# Patient Record
Sex: Female | Born: 1955 | Hispanic: No | Marital: Married | Smoking: Former smoker
Health system: Southern US, Community
[De-identification: ages and names within clinical notes are randomized; demographics above are authoritative.]

## PROBLEM LIST (undated history)

## (undated) DIAGNOSIS — Z91018 Allergy to other foods: Secondary | ICD-10-CM

## (undated) DIAGNOSIS — I82409 Acute embolism and thrombosis of unspecified deep veins of unspecified lower extremity: Secondary | ICD-10-CM

## (undated) DIAGNOSIS — K5120 Ulcerative (chronic) proctitis without complications: Secondary | ICD-10-CM

## (undated) DIAGNOSIS — N83209 Unspecified ovarian cyst, unspecified side: Secondary | ICD-10-CM

## (undated) DIAGNOSIS — N281 Cyst of kidney, acquired: Secondary | ICD-10-CM

## (undated) DIAGNOSIS — M810 Age-related osteoporosis without current pathological fracture: Secondary | ICD-10-CM

## (undated) DIAGNOSIS — B1920 Unspecified viral hepatitis C without hepatic coma: Secondary | ICD-10-CM

## (undated) DIAGNOSIS — T781XXA Other adverse food reactions, not elsewhere classified, initial encounter: Secondary | ICD-10-CM

## (undated) HISTORY — DX: Cyst of kidney, acquired: N28.1

## (undated) HISTORY — PX: COLONOSCOPY: SHX174

## (undated) HISTORY — DX: Allergy to other foods: Z91.018

## (undated) HISTORY — DX: Other adverse food reactions, not elsewhere classified, initial encounter: T78.1XXA

## (undated) HISTORY — DX: Age-related osteoporosis without current pathological fracture: M81.0

## (undated) HISTORY — DX: Ulcerative (chronic) proctitis without complications: K51.20

## (undated) HISTORY — DX: Unspecified viral hepatitis C without hepatic coma: B19.20

## (undated) HISTORY — DX: Acute embolism and thrombosis of unspecified deep veins of unspecified lower extremity: I82.409

## (undated) HISTORY — PX: PARTIAL HYSTERECTOMY: SHX80

## (undated) HISTORY — DX: Unspecified ovarian cyst, unspecified side: N83.209

---

## 1998-06-02 ENCOUNTER — Other Ambulatory Visit: Admission: RE | Admit: 1998-06-02 | Discharge: 1998-06-02 | Payer: Self-pay | Admitting: Obstetrics and Gynecology

## 1999-06-11 ENCOUNTER — Other Ambulatory Visit: Admission: RE | Admit: 1999-06-11 | Discharge: 1999-06-11 | Payer: Self-pay | Admitting: Obstetrics and Gynecology

## 2000-07-07 ENCOUNTER — Other Ambulatory Visit: Admission: RE | Admit: 2000-07-07 | Discharge: 2000-07-07 | Payer: Self-pay | Admitting: Obstetrics and Gynecology

## 2001-09-20 ENCOUNTER — Other Ambulatory Visit: Admission: RE | Admit: 2001-09-20 | Discharge: 2001-09-20 | Payer: Self-pay | Admitting: Obstetrics and Gynecology

## 2001-10-17 ENCOUNTER — Emergency Department (HOSPITAL_COMMUNITY): Admission: EM | Admit: 2001-10-17 | Discharge: 2001-10-17 | Payer: Self-pay | Admitting: Emergency Medicine

## 2002-08-07 ENCOUNTER — Other Ambulatory Visit: Admission: RE | Admit: 2002-08-07 | Discharge: 2002-08-07 | Payer: Self-pay | Admitting: Obstetrics and Gynecology

## 2002-10-22 ENCOUNTER — Encounter (INDEPENDENT_AMBULATORY_CARE_PROVIDER_SITE_OTHER): Payer: Self-pay

## 2002-10-22 ENCOUNTER — Observation Stay (HOSPITAL_COMMUNITY): Admission: RE | Admit: 2002-10-22 | Discharge: 2002-10-23 | Payer: Self-pay | Admitting: Obstetrics and Gynecology

## 2003-12-23 ENCOUNTER — Other Ambulatory Visit: Admission: RE | Admit: 2003-12-23 | Discharge: 2003-12-23 | Payer: Self-pay | Admitting: Obstetrics and Gynecology

## 2004-06-19 ENCOUNTER — Ambulatory Visit: Payer: Self-pay | Admitting: Internal Medicine

## 2005-01-18 ENCOUNTER — Other Ambulatory Visit: Admission: RE | Admit: 2005-01-18 | Discharge: 2005-01-18 | Payer: Self-pay | Admitting: Obstetrics and Gynecology

## 2005-04-13 ENCOUNTER — Ambulatory Visit: Payer: Self-pay | Admitting: Internal Medicine

## 2005-04-21 ENCOUNTER — Ambulatory Visit: Payer: Self-pay

## 2008-04-30 ENCOUNTER — Encounter: Payer: Self-pay | Admitting: Internal Medicine

## 2008-07-16 ENCOUNTER — Ambulatory Visit: Payer: Self-pay | Admitting: Internal Medicine

## 2008-07-16 DIAGNOSIS — M940 Chondrocostal junction syndrome [Tietze]: Secondary | ICD-10-CM

## 2008-07-16 DIAGNOSIS — R740 Nonspecific elevation of levels of transaminase and lactic acid dehydrogenase [LDH]: Secondary | ICD-10-CM

## 2008-07-16 DIAGNOSIS — R7401 Elevation of levels of liver transaminase levels: Secondary | ICD-10-CM | POA: Insufficient documentation

## 2008-07-16 HISTORY — DX: Chondrocostal junction syndrome (tietze): M94.0

## 2008-08-01 ENCOUNTER — Ambulatory Visit: Payer: Self-pay | Admitting: Internal Medicine

## 2008-08-11 ENCOUNTER — Encounter: Payer: Self-pay | Admitting: Internal Medicine

## 2008-08-11 LAB — CONVERTED CEMR LAB
ALT: 76 units/L — ABNORMAL HIGH (ref 0–35)
AST: 69 units/L — ABNORMAL HIGH (ref 0–37)
Albumin: 4 g/dL (ref 3.5–5.2)
Alkaline Phosphatase: 66 units/L (ref 39–117)
Bilirubin, Direct: 0.1 mg/dL (ref 0.0–0.3)
Hgb A1c MFr Bld: 5.5 % (ref 4.6–6.0)
Total Bilirubin: 0.6 mg/dL (ref 0.3–1.2)
Total Protein: 7.9 g/dL (ref 6.0–8.3)

## 2008-08-12 ENCOUNTER — Encounter (INDEPENDENT_AMBULATORY_CARE_PROVIDER_SITE_OTHER): Payer: Self-pay | Admitting: *Deleted

## 2008-08-12 ENCOUNTER — Telehealth (INDEPENDENT_AMBULATORY_CARE_PROVIDER_SITE_OTHER): Payer: Self-pay | Admitting: *Deleted

## 2008-08-19 ENCOUNTER — Encounter: Admission: RE | Admit: 2008-08-19 | Discharge: 2008-08-19 | Payer: Self-pay | Admitting: Family Medicine

## 2008-08-21 ENCOUNTER — Telehealth (INDEPENDENT_AMBULATORY_CARE_PROVIDER_SITE_OTHER): Payer: Self-pay | Admitting: *Deleted

## 2009-02-07 ENCOUNTER — Emergency Department (HOSPITAL_BASED_OUTPATIENT_CLINIC_OR_DEPARTMENT_OTHER): Admission: EM | Admit: 2009-02-07 | Discharge: 2009-02-07 | Payer: Self-pay | Admitting: Emergency Medicine

## 2009-02-12 ENCOUNTER — Ambulatory Visit: Payer: Self-pay | Admitting: Vascular Surgery

## 2009-05-23 ENCOUNTER — Ambulatory Visit: Payer: Self-pay | Admitting: Vascular Surgery

## 2009-05-23 ENCOUNTER — Encounter: Payer: Self-pay | Admitting: Internal Medicine

## 2010-12-08 NOTE — Assessment & Plan Note (Signed)
OFFICE VISIT   Dawn Yang, Dawn Yang  DOB:  1956/05/30                                       05/23/2009  HKVQQ#:[id number]   The patient presents today for continued followup of her venous  pathology.  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  She has had complete  resolution of the thrombus.  She does continue to have pain over these  areas with prolonged standing.  She does wear thigh-high graduated  compression stockings but sees no benefit from this.   PAST MEDICAL HISTORY:  Otherwise unchanged.  She has no new major  medical difficulties.  She denies any major cardiac, pulmonary or GI  dysfunction.   PHYSICAL EXAMINATION:  Today, she does continue to have large  varicosities over her left medial calf.  She does have some tenderness  specifically over these and has pain with prolonged standing related to  this as well.  She is 2+ dorsalis pedis pulse.  Generally, she is  neurologically intact and does not have any current thrombophlebitis.   I reviewed her venous duplex with the patient.  I explained again the  significance of her venous hypertension and the relation to the  varicosities.  I have recommended that we proceed with laser ablation of  her great saphenous vein and stab phlebectomy of her multiple tributary  varicosities.  She has clearly failed conservative therapy with  elevation, ibuprofen and compression.  She reports that she likes to  exercise and walk for this and has to decrease due to the pain.  Also  experiences pain with prolonged sitting which her job requires and also  difficulty with squatting position, making leg work and housework  difficult.  I explained the procedure is an outpatient procedure taking  approximately 1-1/2 hours under local anesthesia.  She understands and  wishes to  proceed when we can assure insurance coverage.   Larina Earthly, M.D.  Electronically Signed   TFE/MEDQ  D:  05/23/2009  T:  05/26/2009  Job:  [id number]   cc:   Titus Dubin. Alwyn Ren, MD,FACP,FCCP

## 2010-12-08 NOTE — Procedures (Signed)
LOWER EXTREMITY VENOUS REFLUX EXAM   INDICATION:  Left lower extremity painful varicose veins.   EXAM:  Using color-flow imaging and pulse Doppler spectral analysis, the  left common femoral, superficial femoral, popliteal, posterior tibial,  greater and lesser saphenous veins are evaluated.  There is no evidence  suggesting deep venous insufficiency in the left lower extremity.   The left saphenofemoral junction is not competent with reflux of >500  milliseconds.  The left GSV is not competent with reflux of >500  milliseconds with the caliber as described below.   The left proximal short saphenous vein demonstrates competency.   GSV Diameter (used if found to be incompetent only)                                            Right    Left  Proximal Greater Saphenous Vein           cm       0.58 cm  Proximal-to-mid-thigh                     cm       cm  Mid thigh                                 cm       0.53 cm  Mid-distal thigh                          cm       cm  Distal thigh                              cm       0.56 cm  Knee                                      cm       0.55 cm   IMPRESSION:  1. Left greater saphenous vein reflux with >500 milliseconds is      identified with the caliber ranging from 0.53 cm to 0.58 cm knee to      groin.  2. The left greater saphenous vein is not aneurysmal.  3. The left greater saphenous vein is not tortuous.  4. The deep venous system is competent.  5. The left lesser saphenous vein is competent.  6. Evidence of acute thrombus in varicose vein off of left greater      saphenous vein in the proximal calf.        ___________________________________________  Larina Earthly, M.D.   AS/MEDQ  D:  02/12/2009  T:  02/12/2009  Job:  [id number]

## 2010-12-08 NOTE — Consult Note (Signed)
NEW PATIENT CONSULTATION   Dawn Yang, Dawn Yang  DOB:  08-01-1955                                       02/12/2009  ONGEX#:[id number]   The patient presents today for evaluation of left leg venous pathology.  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  She had an episode on 02/07/2009 where  she had blunt trauma to this area and had extensive bruising and  hematoma formation.  She was seen in the emergency department and she  was recommended she use ice, compression and walk on crutches.  She did  not have any imaging studies at that time.  She reports that this is  beginning to slowly resolve with the discomfort associated with this.  She does report that with prolonged standing prior to the injury she did  have pain and discomfort over the varicosity itself and also a tired,  heavy and achy sensation in her left leg in general.  She does report  lower extremity swelling on the left.  She has not worn compression  garments and does elevate the legs when possible.   PAST MEDICAL HISTORY:  Negative for deep venous thrombosis.  She has no  major medical difficulties specifically no cardiac or pulmonary  dysfunction.  She does have a family history of premature  atherosclerotic disease in her mother.   SOCIAL HISTORY:  She is married.  She does not smoke having quit 12  years ago.  Does have a glass of wine every other day.   REVIEW OF SYSTEMS:  CARDIAC:  Positive for palpitations.  VASCULAR:  Positive for pain in her legs with walking.  Otherwise  completely negative.   ALLERGIES:  Her medication allergies are to codeine, morphine and  hydrocodone which causes GI upset.   CURRENT MEDICATIONS:  Ambien p.r.n. and multivitamins.   PHYSICAL EXAMINATION:  General:  A well-developed, well-nourished white  female appearing stated age of 64.  Vital  signs:  Blood pressure 115/84,  pulse 72, respirations 18.  Her radial pulses are 2+.  She has 2+  dorsalis pedis pulses bilaterally.  Right leg does have some vascular  blushes around the level of her ankle and a few scattered telangiectasia  but no varicosities.  In the left leg she does have significant bruising  in the left medial calf and also has varicosities in this area.   She underwent venous duplex in our office.  This shows no evidence of  deep venous thrombosis.  She does have clot in the varicosities.  She  does have reflux throughout an enlarged great saphenous vein from her  groin distally.  I have discussed the significance of this with the  patient.  I explained that she is at no risk for increased risk for DVT  or other more serious complications.  I explained to resume her full  activity without limitation.  I explained that it will require some time  for resolution of her bruising and for superficial thrombophlebitis in  her saphenous tributary varicosities.  I did discuss further treatment  of her varicose vein pain and we have fitted her today with thigh high  graduated compression garments 20-30 mmHg.  I instructed her on the  daily use of these.  She will  elevate her legs when possible and  continue with her ibuprofen for discomfort.  I plan to see her again in  3 months for continued discussion.  I did explain that she is a  candidate for laser ablation of her great saphenous vein and stab  phlebectomy of tributary varicosities should she fail conservative  treatment.  We will see her again in 3 months.   Larina Earthly, M.D.  Electronically Signed   TFE/MEDQ  D:  02/12/2009  T:  02/13/2009  Job:  [id number]   cc:   Titus Dubin. Alwyn Ren, MD,FACP,FCCP

## 2010-12-11 NOTE — Op Note (Signed)
NAME:  Dawn Yang, Dawn Yang                ACCOUNT NO.:  [id number]   MEDICAL RECORD NO.:  [id number]                   PATIENT TYPE:  OBV   LOCATION:  9399                                 FACILITY:  [facility]   PHYSICIAN:  Juluis Mire, M.D.                DATE OF BIRTH:  01/21/1956   DATE OF PROCEDURE:  DATE OF DISCHARGE:                                 OPERATIVE REPORT   PREOPERATIVE DIAGNOSES:  1. Abnormal uterine bleeding and pelvic pain.  2. 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   POSTOPERATIVE DIAGNOSES:  1. Abnormal uterine bleeding and pelvic pain.  2. 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  3. Pathology pending.   PROCEDURE:  Laparoscopically-assisted vaginal hysterectomy.   SURGEON:  Juluis Mire, M.D.   ASSISTANT:  Dineen Kid. Rana Snare, M.D.   ESTIMATED BLOOD LOSS:  300 mL.   PACKS AND DRAINS:  None.   INTRAOPERATIVE BLOOD REPLACED:  None.   INDICATIONS:  The indications are as noted in the history and physical.   DESCRIPTION OF PROCEDURE:  The patient was taken to the operating room and  placed in the supine position.  After a satisfactory level of general  endotracheal anesthesia was obtained, the patient was placed in the dorsal  lithotomy position using the Allen stirrups.  The abdomen, perineum and  vagina were prepped out with Betadine.  The bladder was emptied by in and  out catheterization.  A Hulka tenaculum was put in place and secured.  The  patient was then draped out for surgery.  A subumbilical incision was made  with the knife.  The Veress needle was introduced into the abdominal cavity.  The abdomen was insufflated with approximately 4 liters of  carbon dioxide.  The operating laparoscope was introduced.  There was no evidence of injury  to adjacent organs.  A 5 mm trocar was put in place in the suprapubic area  under direct visualization. Again, no injury to adjacent organs.  Visualization revealed the uterus to be upper limits of normal in size.  Tubes and  ovaries were unremarkable.  There were some adhesions from the  left ovary to the sigmoid colon.  These were fairly minimal.  The appendix  was seen and noted to be normal.  The upper abdomen including the liver and  the tip of the gallbladder were clear.  Using the Gyrus tripolar, we first  went to the right side.  The right round ligament was cauterized and  incised.  The right tube and mesosalpinx was cauterized and incised, and the  right utero-ovarian pedicle was cauterized and incised.  We then went to the  left side where the left round ligament was cauterized and incised.  The  left tube and mesosalpinx were cauterized and incised.  The left utero-  ovarian pedicle was cauterized and incised. We had good separation of the  adnexa.  No active bleeding was noted.  The decision was to go vaginally at  this point.  The laparoscope was removed.  The abdomen was deflated of its  carbon dioxide.   The patient's legs were repositioned.  The Hulka tenaculum was then removed.  A weighted speculum was placed in the vaginal vault.  Jacob's tenaculum was  used to grasp the cervix.  The cul-de-sac was entered sharply.  Both of the  uterosacral ligaments were clamped, cut and suture ligated with 0 Vicryl.  The reflexion of the vaginal mucosa anteriorly was incised using the Bovie.  The bladder was dissected superiorly.  Paracervical tissue was clamped, cut  and suture ligated with 0 Vicryl.  The vesicouterine space was entered.  Retractor was put in place to retract the bladder superiorly.  Using the  clamp, cut and tie technique with a suture ligature of 0 Vicryl, the  parametrium was serially separated from the sides of the uterus and the  remaining pedicle was clamped and cut.  The uterus was passed off the  operative field.  Held pedicles were ligated with three ties of 0 Vicryl.  The vaginal mucosa was reapproximated in a vertical fashion in the midline  with figure-of-eight of 0 Vicryl  bringing together the uterosacral  ligaments.  Foley was placed to straight drain with retrieval of adequate  amount of clear urine.  The sponge stick was placed in the vaginal vault.  The weighted speculum was then removed, and the patient's legs were  repositioned.   The abdomen was reinflated of its carbon dioxide and laparoscope was  reintroduced.  There was some bleeding from the bladder base, mainly in the  form of oozing.  This was brought under control with cautery.  We had good  hemostasis.  At this point in time, adequate clear urine was being noted in  the Foley.  At this point in time, the abdomen was deflated of its carbon  dioxide.  All trocars were removed.  The subumbilical incision was closed  with interrupted subcuticulars of 4-0 Vicryl.  The suprapubic incision was  closed with Steri-Strips. The sponge on the sponge stick was removed from  the vaginal vault.  The patient was taken out of the dorsal lithotomy  position and once alert, extubated and transferred to the recovery room in  good condition.  Sponge, needle and instrument count reported correct by the  circulating nurse x2.                                               Juluis Mire, M.D.    JSM/MEDQ  D:  10/22/2002  T:  10/22/2002  Job:  [id number]

## 2010-12-11 NOTE — Discharge Summary (Signed)
   NAME:  Dawn Yang, Dawn Yang                ACCOUNT NO.:  [id number]   MEDICAL RECORD NO.:  [id number]                   PATIENT TYPE:  OBV   LOCATION:  9304                                 FACILITY:  [facility]   PHYSICIAN:  Juluis Mire, M.D.                DATE OF BIRTH:  [birth year]   DATE OF ADMISSION:  10/22/2002  DATE OF DISCHARGE:  10/23/2002                                 DISCHARGE SUMMARY   ADMITTING DIAGNOSES:  1. Menorrhagia.  2. Dysmenorrhea.  3. Possible uterine adenomyosis.   DISCHARGE DIAGNOSES:  1. Menorrhagia.  2. Dysmenorrhea.  3. Possible uterine adenomyosis.  4. Pathology pending.   OPERATIVE PROCEDURE:  Laparoscopic assisted vaginal hysterectomy.   HISTORY OF PRESENT ILLNESS:  For complete history and physical please see  dictated note.   HOSPITAL COURSE:  The patient underwent above noted surgery.  Postoperatively did extremely well.  Postoperative hemoglobin was 11.3.  Pathology is still pending.  Discharged home on first postoperative day.  At  that time she was afebrile with stable vital signs.  Abdomen is soft and  nontender.  Bowel sounds were active.  Both suprapubic and subumbilical  incisions were intact.  She was having minimal vaginal bleeding.   COMPLICATIONS:  In terms of complications, none were encountered during stay  in hospital.  The patient discharged home in stable condition.   DISPOSITION:  The patient is to avoid heavy lifting, vaginal entrance, or  driving a car.  She will watch for signs of infection, nausea, vomiting,  active vaginal bleeding, or excessive pain.  Discharged home with Demerol as  she needs for pain.  Reevaluation in the office in one week.                                               Juluis Mire, M.D.    JSM/MEDQ  D:  10/23/2002  T:  10/23/2002  Job:  [id number]

## 2010-12-11 NOTE — H&P (Signed)
NAME:  Dawn Yang, Dawn Yang                ACCOUNT NO.:  [id number]   MEDICAL RECORD NO.:  [id number]                   PATIENT TYPE:  AMB   LOCATION:  SDC                                  FACILITY:  [facility]   PHYSICIAN:  Juluis Mire, M.D.                DATE OF BIRTH:  Apr 15, 1956   DATE OF ADMISSION:  10/22/2002  DATE OF DISCHARGE:                                HISTORY & PHYSICAL   HISTORY OF PRESENT ILLNESS:  The patient is a 55 year old gravida 3, para 0,  AB-3 white female who presents for laparoscopic assisted vaginal  hysterectomy.  In relation to the present admission the patient has been  having trouble with increasing menstrual irregularities which have been  extremely heavy and painful.  Associated with this is increasing pain with  intercourse that is becoming limiting.  She is also having postcoital  spotting.  She had undergone a previous hysteroscopy and laparoscopy with  finding of an intrauterine polyp that was hysteroscopically removed and  found to be benign.  She did have overall uterine enlargement as well as  fibroids consistent with adenomyosis.  The patient has been offered options.  She does have a history of chronically elevated liver function tests,  because of this she cannot take birth control pills.  She declines other  attempts at hormonal management.  We have discussed other options including  endometrial ablation.  The patient wishes to proceed with definitive therapy  in the form of laparoscopic assisted vaginal hysterectomy for which she is  admitted at the present time.   ALLERGIES:  She is allergic to Codeine.   MEDICATIONS:  None.   PAST MEDICAL HISTORY:  Usual childhood diseases without any significant  sequela.  She does have a history of chronically elevated liver function  tests.  These have been evaluated by her primary are doctor with a basically  negative work up.   SURGICAL HISTORY:  She has had the previous hysteroscopy and  laparoscopy as  noted.   SOCIAL HISTORY:  Does reveal some past tobacco use although none at the  present time.  No alcohol use.   FAMILY HISTORY:  No significant family history.   REVIEW OF SYSTEMS:  Noncontributory.   PHYSICAL EXAMINATION:  VITAL SIGNS: The patient is afebrile with stable  vital signs.  HEENT EXAM: The patient is normocephalic.  Pupils are equal, round and  reactive to light and accommodation.  Extraocular movements are intact.  Sclerae and conjunctivae clear.  Oropharynx clear.  NECK: Without thyromegaly.  BREASTS: No discreet masses.  LUNGS: Clear.  CARDIOVASCULAR: Regular rate and rhythm without murmurs or gallops heard.  ABDOMINAL EXAM: Benign, no mass, organomegaly or tenderness.  PELVIC: Normal external genitalia, vaginal mucosa is clear.  Cervix  unremarkable. Uterus normal size, shape and contour.  Adnexae free of masses  or tenderness.  Rectovaginal exam is clear.  EXTREMITIES: Trace edema.  NEUROLOGIC EXAM: Grossly within normal limits.   IMPRESSION:  Abnormal uterine bleeding and continued pelvic pain, possible  uterine adenomyosis.   PLAN:  Again alternatives have been discussed, the patient wishes to proceed  with laparoscopic assisted vaginal hysterectomy.  The risks of surgery have  been discussed including the risk of infection.  The risk of hemorrhage that  can necessitate transfusion with the risk of AIDS or hepatitis. The risk of  injury to adjacent organs including bladder, bowel or ureters that could  require further exploratory surgery.  The risk of deep venous thrombosis and  pulmonary embolus.  The patient expressed her understanding of indications  and risks and accepted.                                                 Juluis Mire, M.D.    JSM/MEDQ  D:  10/22/2002  T:  10/22/2002  Job:  [id number]

## 2011-11-09 ENCOUNTER — Other Ambulatory Visit: Payer: Self-pay | Admitting: Vascular Surgery

## 2013-12-18 ENCOUNTER — Encounter: Payer: Self-pay | Admitting: Internal Medicine

## 2014-02-22 ENCOUNTER — Encounter: Payer: Self-pay | Admitting: Internal Medicine

## 2014-02-28 ENCOUNTER — Encounter: Payer: Self-pay | Admitting: Internal Medicine

## 2014-03-15 ENCOUNTER — Ambulatory Visit (AMBULATORY_SURGERY_CENTER): Payer: Self-pay | Admitting: *Deleted

## 2014-03-15 VITALS — Ht 61.0 in | Wt 148.0 lb

## 2014-03-15 DIAGNOSIS — Z1211 Encounter for screening for malignant neoplasm of colon: Secondary | ICD-10-CM

## 2014-03-15 MED ORDER — NA SULFATE-K SULFATE-MG SULF 17.5-3.13-1.6 GM/177ML PO SOLN: 1.0000 | Freq: Once | ORAL | Status: DC

## 2014-03-15 NOTE — Progress Notes (Signed)
No egg or soy allergy. No anesthesia problems.  No home O2.  No diet meds.  No metal in body.

## 2014-03-25 ENCOUNTER — Encounter: Payer: Self-pay | Admitting: Internal Medicine

## 2014-03-28 ENCOUNTER — Encounter: Payer: Self-pay | Admitting: Gastroenterology

## 2014-03-29 ENCOUNTER — Encounter: Payer: Self-pay | Admitting: Internal Medicine

## 2014-03-29 ENCOUNTER — Ambulatory Visit (AMBULATORY_SURGERY_CENTER): Payer: BC Managed Care – PPO | Admitting: Internal Medicine

## 2014-03-29 VITALS — BP 100/63 | HR 72 | Temp 97.3°F | Resp 17 | Ht 61.0 in | Wt 148.0 lb

## 2014-03-29 DIAGNOSIS — K5289 Other specified noninfective gastroenteritis and colitis: Secondary | ICD-10-CM

## 2014-03-29 DIAGNOSIS — K6289 Other specified diseases of anus and rectum: Secondary | ICD-10-CM

## 2014-03-29 DIAGNOSIS — Z1211 Encounter for screening for malignant neoplasm of colon: Secondary | ICD-10-CM

## 2014-03-29 MED ORDER — SODIUM CHLORIDE 0.9 % IV SOLN: 500.0000 mL | INTRAVENOUS | Status: DC

## 2014-03-29 NOTE — Progress Notes (Signed)
Report to PACU, RN, vss, BBS= Clear.  

## 2014-03-29 NOTE — Progress Notes (Signed)
Called to room to assist during endoscopic procedure.  Patient ID and intended procedure confirmed with present staff. Received instructions for my participation in the procedure from the performing physician.  

## 2014-03-29 NOTE — Op Note (Signed)
[place]  [address]. [place], [id number]   COLONOSCOPY PROCEDURE REPORT  PATIENT: Dawn Yang, Dawn Yang  MR#: [id number] BIRTHDATE: [birth year] , 22  yrs. old GENDER: Female ENDOSCOPIST: Gatha Mayer, MD, [facility] PROCEDURE DATE:  03/29/2014 PROCEDURE:   Colonoscopy with biopsy First Screening Colonoscopy - Avg.  risk and is 50 yrs.  old or older - No.  Prior Negative Screening - Now for repeat screening. 10 or more years since last screening  History of Adenoma - Now for follow-up colonoscopy & has been > or = to 3 yrs.  N/A  Polyps Removed Today? No.  Recommend repeat exam, <10 yrs? No. ASA CLASS:   Class II INDICATIONS:average risk screening and Last colonoscopy performed 10 years ago. MEDICATIONS: propofol (Diprivan) 300mg  IV, MAC sedation, administered by CRNA, and These medications were titrated to patient response per physician's verbal order  DESCRIPTION OF PROCEDURE:   After the risks benefits and alternatives of the procedure were thoroughly explained, informed consent was obtained.  A digital rectal exam revealed no abnormalities of the rectum.   The [facility] [id number]  endoscope was introduced through the anus and advanced to the cecum, which was identified by both the appendix and ileocecal valve. No adverse events experienced.   The quality of the prep was excellent using Suprep  The instrument was then slowly withdrawn as the colon was fully examined.  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 Very distal rectum with circunferential aphthae and granular mucosa.  Multiple biopsies were performed using cold forceps.   The colon mucosa was otherwise normal.   A right colon retroflexion was performed.  Retroflexed views revealed no other abnormalities. The time to cecum=3 minutes 37 seconds. Withdrawal time=8 minutes 39 seconds.  The scope was withdrawn and the procedure  completed. COMPLICATIONS: There were no complications.  ENDOSCOPIC IMPRESSION: 1.   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   The colon mucosa was otherwise normal  RECOMMENDATIONS: Office will call with the results.   eSigned:  Gatha Mayer, MD, [facility] 03/29/2014 3:33 PM   cc: The Patient

## 2014-03-29 NOTE — Patient Instructions (Addendum)
No polyps or cancer. There was a small area of inflammation in the rectum - could be from the prep - I took biopsies to see what the cause is.  I will let you know results next week most likely.  Enjoy your green drink and the burger!  I appreciate the opportunity to care for you. Gatha Mayer, MD, FACG   YOU HAD AN ENDOSCOPIC PROCEDURE TODAY AT [facility]: Refer to the procedure report that was given to you for any specific questions about what was found during the examination.  If the procedure report does not answer your questions, please call your gastroenterologist to clarify.  If you requested that your care partner not be given the details of your procedure findings, then the procedure report has been included in a sealed envelope for you to review at your convenience later.  YOU SHOULD EXPECT: Some feelings of bloating in the abdomen. Passage of more gas than usual.  Walking can help get rid of the air that was put into your GI tract during the procedure and reduce the bloating. If you had a lower endoscopy (such as a colonoscopy or flexible sigmoidoscopy) you may notice spotting of blood in your stool or on the toilet paper. If you underwent a bowel prep for your procedure, then you may not have a normal bowel movement for a few days.  DIET: Your first meal following the procedure should be a light meal and then it is ok to progress to your normal diet.  A half-sandwich or bowl of soup is an example of a good first meal.  Heavy or fried foods are harder to digest and may make you feel nauseous or bloated.  Likewise meals heavy in dairy and vegetables can cause extra gas to form and this can also increase the bloating.  Drink plenty of fluids but you should avoid alcoholic beverages for 24 hours.  ACTIVITY: Your care partner should take you home directly after the procedure.  You should plan to take it easy, moving slowly for the rest of the day.  You can resume normal  activity the day after the procedure however you should NOT DRIVE or use heavy machinery for 24 hours (because of the sedation medicines used during the test).    SYMPTOMS TO REPORT IMMEDIATELY: A gastroenterologist can be reached at any hour.  During normal business hours, 8:30 AM to 5:00 PM Monday through Friday, call [phone].  After hours and on weekends, please call the GI answering service at [phone] who will take a message and have the physician on call contact you.   Following lower endoscopy (colonoscopy or flexible sigmoidoscopy):  Excessive amounts of blood in the stool  Significant tenderness or worsening of abdominal pains  Swelling of the abdomen that is new, acute  Fever of 100F or higher  Following upper endoscopy (EGD)  Vomiting of blood or coffee ground material  New chest pain or pain under the shoulder blades  Painful or persistently difficult swallowing  New shortness of breath  Fever of 100F or higher  Black, tarry-looking stools  FOLLOW UP: If any biopsies were taken you will be contacted by phone or by letter within the next 1-3 weeks.  Call your gastroenterologist if you have not heard about the biopsies in 3 weeks.  Our staff will call the home number listed on your records the next business day following your procedure to check on you and address any questions or concerns  that you may have at that time regarding the information given to you following your procedure. This is a courtesy call and so if there is no answer at the home number and we have not heard from you through the emergency physician on call, we will assume that you have returned to your regular daily activities without incident.  SIGNATURES/CONFIDENTIALITY: You and/or your care partner have signed paperwork which will be entered into your electronic medical record.  These signatures attest to the fact that that the information above on your After Visit Summary has been reviewed and is  understood.  Full responsibility of the confidentiality of this discharge information lies with you and/or your care-partner.   Await biopsy results

## 2014-04-02 ENCOUNTER — Telehealth: Payer: Self-pay

## 2014-04-02 NOTE — Telephone Encounter (Signed)
  Follow up Call-  Call back number 03/29/2014  Post procedure Call Back phone  # [phone]  Permission to leave phone message Yes     Patient questions:  Do you have a fever, pain , or abdominal swelling? No. Pain Score  0 *  Have you tolerated food without any problems? Yes.    Have you been able to return to your normal activities? Yes.    Do you have any questions about your discharge instructions: Diet   No. Medications  No. Follow up visit  No.  Do you have questions or concerns about your Care? No.  Actions: * If pain score is 4 or above: No action needed, pain <4.  Per the pt she said it took a couple of days for the anesthesia to wear out of her system.  Said had loose stools too.  I said rectal bx were taken and Dr. Carlean Purl would be getting in touch with her with the results.  To call if any questions or concerns.  maw

## 2014-04-04 ENCOUNTER — Encounter: Payer: Self-pay | Admitting: Internal Medicine

## 2014-04-04 DIAGNOSIS — K5120 Ulcerative (chronic) proctitis without complications: Secondary | ICD-10-CM

## 2014-04-04 HISTORY — DX: Ulcerative (chronic) proctitis without complications: K51.20

## 2014-04-04 NOTE — Progress Notes (Signed)
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

## 2014-04-05 ENCOUNTER — Other Ambulatory Visit: Payer: Self-pay

## 2014-04-05 MED ORDER — MESALAMINE 1000 MG RE SUPP: 1000.0000 mg | Freq: Every day | RECTAL | Status: DC

## 2014-05-01 ENCOUNTER — Ambulatory Visit: Payer: Self-pay | Admitting: Internal Medicine

## 2014-06-06 ENCOUNTER — Ambulatory Visit: Payer: BC Managed Care – PPO | Admitting: Internal Medicine

## 2015-04-01 ENCOUNTER — Telehealth: Payer: Self-pay | Admitting: Internal Medicine

## 2015-04-01 NOTE — Telephone Encounter (Signed)
Patient with RUQ pain this weekend after a meal.  She states that all of her symptoms have resolved now, but wants to initiate a work up prior to a big trip to [place] at the end of the month.  She will come see Arta Bruce, PA on 04/07/15 1:15

## 2015-04-02 ENCOUNTER — Encounter: Payer: Self-pay | Admitting: Internal Medicine

## 2015-04-08 ENCOUNTER — Ambulatory Visit: Payer: Self-pay | Admitting: Physician Assistant

## 2015-06-04 ENCOUNTER — Ambulatory Visit: Payer: Self-pay | Admitting: Internal Medicine

## 2015-07-09 ENCOUNTER — Encounter: Payer: Self-pay | Admitting: Internal Medicine

## 2015-07-29 ENCOUNTER — Ambulatory Visit: Payer: Self-pay | Admitting: Internal Medicine

## 2015-09-12 ENCOUNTER — Ambulatory Visit: Payer: Self-pay | Admitting: Internal Medicine

## 2015-09-30 ENCOUNTER — Encounter: Payer: Self-pay | Admitting: Internal Medicine

## 2015-12-26 ENCOUNTER — Ambulatory Visit: Payer: Self-pay | Admitting: Internal Medicine

## 2016-02-13 ENCOUNTER — Encounter: Payer: Self-pay | Admitting: Internal Medicine

## 2016-02-13 ENCOUNTER — Ambulatory Visit (INDEPENDENT_AMBULATORY_CARE_PROVIDER_SITE_OTHER): Payer: BLUE CROSS/BLUE SHIELD | Admitting: Internal Medicine

## 2016-02-13 ENCOUNTER — Other Ambulatory Visit (INDEPENDENT_AMBULATORY_CARE_PROVIDER_SITE_OTHER): Payer: BLUE CROSS/BLUE SHIELD

## 2016-02-13 VITALS — BP 120/78 | HR 78 | Ht 59.5 in | Wt 137.0 lb

## 2016-02-13 DIAGNOSIS — R5383 Other fatigue: Secondary | ICD-10-CM

## 2016-02-13 DIAGNOSIS — R10813 Right lower quadrant abdominal tenderness: Secondary | ICD-10-CM

## 2016-02-13 DIAGNOSIS — R160 Hepatomegaly, not elsewhere classified: Secondary | ICD-10-CM

## 2016-02-13 DIAGNOSIS — R10811 Right upper quadrant abdominal tenderness: Secondary | ICD-10-CM

## 2016-02-13 DIAGNOSIS — R5381 Other malaise: Secondary | ICD-10-CM

## 2016-02-13 LAB — CBC WITH DIFFERENTIAL/PLATELET
BASOS ABS: 0 10*3/uL (ref 0.0–0.1)
Basophils Relative: 0.4 % (ref 0.0–3.0)
Eosinophils Absolute: 0.2 10*3/uL (ref 0.0–0.7)
Eosinophils Relative: 2.1 % (ref 0.0–5.0)
HEMATOCRIT: 42.8 % (ref 36.0–46.0)
Hemoglobin: 14.4 g/dL (ref 12.0–15.0)
LYMPHS PCT: 37.7 % (ref 12.0–46.0)
Lymphs Abs: 2.9 10*3/uL (ref 0.7–4.0)
MCHC: 33.7 g/dL (ref 30.0–36.0)
MCV: 86 fl (ref 78.0–100.0)
MONOS PCT: 8.6 % (ref 3.0–12.0)
Monocytes Absolute: 0.7 10*3/uL (ref 0.1–1.0)
Neutro Abs: 4 10*3/uL (ref 1.4–7.7)
Neutrophils Relative %: 51.2 % (ref 43.0–77.0)
Platelets: 235 10*3/uL (ref 150.0–400.0)
RBC: 4.97 Mil/uL (ref 3.87–5.11)
RDW: 13 % (ref 11.5–15.5)
WBC: 7.8 10*3/uL (ref 4.0–10.5)

## 2016-02-13 LAB — COMPREHENSIVE METABOLIC PANEL
ALK PHOS: 90 U/L (ref 39–117)
ALT: 99 U/L — ABNORMAL HIGH (ref 0–35)
AST: 82 U/L — AB (ref 0–37)
Albumin: 4.4 g/dL (ref 3.5–5.2)
BILIRUBIN TOTAL: 0.6 mg/dL (ref 0.2–1.2)
BUN: 15 mg/dL (ref 6–23)
CO2: 29 mEq/L (ref 19–32)
CREATININE: 0.67 mg/dL (ref 0.40–1.20)
Calcium: 10.1 mg/dL (ref 8.4–10.5)
Chloride: 103 mEq/L (ref 96–112)
GFR: 95.17 mL/min (ref >60.00)
GLUCOSE: 104 mg/dL — AB (ref 70–99)
Potassium: 3.5 mEq/L (ref 3.5–5.1)
Sodium: 138 mEq/L (ref 135–145)
TOTAL PROTEIN: 8.4 g/dL — AB (ref 6.0–8.3)

## 2016-02-13 LAB — TSH: TSH: 1.19 u[IU]/mL (ref 0.35–4.50)

## 2016-02-13 NOTE — Patient Instructions (Addendum)
Your physician has requested that you go to the basement for the following lab work before leaving today: TSH, CMET, CBC/diff   You have been scheduled for an abdominal ultrasound at [facility] (1st floor of hospital) on 02/19/16 at 9:00AM. Please arrive 15 minutes prior to your appointment for registration. Make certain not to have anything to eat or drink 6 hours prior to your appointment. Should you need to reschedule your appointment, please contact radiology at [phone]. This test typically takes about 30 minutes to perform.    I appreciate the opportunity to care for you. Silvano Rusk, MD, [facility]

## 2016-02-13 NOTE — Progress Notes (Signed)
Subjective:    Patient ID: Dawn Yang, female    DOB: [birth year], 60 y.o.   MRN: [id number]  CC: abdominal pain  HPI Dawn Yang is a pleasant 60 y.o. female with a history of osteoporosis and ulcerative proctitis, presenting to the office with right sided abdominal pain that is intermittent for the past few months. Pain described as aching-dull that radiates to right flank usually begins after waking, progressing through out the day but resolves the next day repeating the same process. At work she sits at a desk and does not associate any injury or heavy lifting. Does not notice any food association with the pain. Has been getting tired by the end of her work days.  Denies any bowel changes, contipation, diarrhea, nausea, vomiting, dysuria, hematochezia, or melena.  Allergies  Allergen Reactions  . Codeine Nausea And Vomiting  . Morphine And Related Nausea And Vomiting   Outpatient Prescriptions Prior to Visit  Medication Sig Dispense Refill  . aspirin EC 81 MG tablet Take 81 mg by mouth daily.    . mesalamine (CANASA) 1000 MG suppository Place 1 suppository (1,000 mg total) rectally at bedtime. 30 suppository 11  . minocycline (MINOCIN,DYNACIN) 100 MG capsule Take 100 mg by mouth 2 (two) times daily.    . Multiple Vitamins-Minerals (MULTIVITAMIN PO) Take 1 tablet by mouth daily.    . Omega-3 Fatty Acids (OMEGA 3 PO) Take by mouth.    Marland Kitchen OVER THE COUNTER MEDICATION Take 1 scoop by mouth daily. Barley greens    . traZODone (DESYREL) 50 MG tablet Take 50 mg by mouth at bedtime as needed for sleep. Reported on 02/13/2016     No facility-administered medications prior to visit.   Past Medical History  Diagnosis Date  . DVT (deep venous thrombosis) ([place])   . Osteoporosis     osteopenia  . Ovarian cyst   . Renal cyst   . Chronic ulcerative proctitis without complications ([place]) [id number]   Past Surgical History  Procedure Laterality Date  . Colonoscopy    . Partial  hysterectomy     Social History   Social History  . Marital Status: Unknown    Spouse Name: N/A  . Number of Children: N/A  . Years of Education: N/A   Social History Main Topics  . Smoking status: Former Research scientist (life sciences)  . Smokeless tobacco: Never Used  . Alcohol Use: 1.2 oz/week    2 Glasses of wine per week     Comment: occasional  . Drug Use: None  . Sexual Activity: Not Asked   Other Topics Concern  . None   Social History Narrative   Family History  Problem Relation Age of Onset  . Colon cancer Neg Hx   . Kidney disease Father   . Diabetes Father   . Diabetes Paternal Grandmother         Review of Systems See HPI, all other systems negative    Objective:   Physical Exam   @BP  120/78 mmHg  Pulse 78  Ht 4' 11.5" (1.511 m)  Wt 137 lb (62.143 kg)  BMI 27.22 kg/m2  SpO2 99%@  General:  Well-developed, well-nourished and in no acute distress Eyes:  anicteric..  Lungs: Clear to auscultation bilaterally. Heart:  S1S2, no rubs, murmurs, gallops. Abdomen: soft, mildly tender to the RUQ and RLQ, no hepatosplenomegaly, hernia, or mass and BS+.    No CVA tenderness, Manipulation of the hip in flexion, external and internal rotation did not elicit pain. Extremities:  no edema, cyanosis or clubbing. Neuro:  A&O x 3.  Psych:  appropriate mood and  Affect.   Data Reviewed: Colonoscopy from 03/29/2014     Assessment & Plan:   Encounter Diagnoses  Name Primary?  . RUQ abdominal tenderness Yes  . RLQ abdominal tenderness   . Hepatomegaly   . Malaise and fatigue    Clinically pain seems musculoskeletal but given her history of hepatomegaly and having abdominal pain with fatigue we will obtain a ultrasound, CBC, CMP, and TSH.    Grayland Ormond PA-S  I have seen the patient with Mr. Venetia Maxon and he has served as a Education administrator.

## 2016-02-18 NOTE — Progress Notes (Signed)
Thyroid NL Liver tests mildly abnormal  Await [place] results (8/4)

## 2016-02-19 ENCOUNTER — Ambulatory Visit (HOSPITAL_COMMUNITY): Payer: BLUE CROSS/BLUE SHIELD

## 2016-02-27 ENCOUNTER — Ambulatory Visit (HOSPITAL_COMMUNITY)
Admission: RE | Admit: 2016-02-27 | Discharge: 2016-02-27 | Disposition: A | Discharge status: Home / Self Care | Payer: BLUE CROSS/BLUE SHIELD | Source: Ambulatory Visit | Attending: Internal Medicine | Admitting: Internal Medicine

## 2016-02-27 DIAGNOSIS — R10813 Right lower quadrant abdominal tenderness: Secondary | ICD-10-CM | POA: Insufficient documentation

## 2016-02-27 DIAGNOSIS — R160 Hepatomegaly, not elsewhere classified: Secondary | ICD-10-CM | POA: Insufficient documentation

## 2016-02-27 DIAGNOSIS — R10811 Right upper quadrant abdominal tenderness: Secondary | ICD-10-CM | POA: Insufficient documentation

## 2016-03-01 NOTE — Progress Notes (Signed)
Let her know [place] is ok Liver not abnormal or enlarged in particular Think pain she has is likely musculoskeletal See me prn

## 2017-05-30 ENCOUNTER — Other Ambulatory Visit: Payer: Self-pay | Admitting: Physician Assistant

## 2017-05-30 DIAGNOSIS — B182 Chronic viral hepatitis C: Secondary | ICD-10-CM

## 2017-06-07 ENCOUNTER — Ambulatory Visit
Admission: RE | Admit: 2017-06-07 | Discharge: 2017-06-07 | Disposition: A | Discharge status: Home / Self Care | Payer: BLUE CROSS/BLUE SHIELD | Source: Ambulatory Visit | Attending: Physician Assistant | Admitting: Physician Assistant

## 2017-06-07 DIAGNOSIS — B182 Chronic viral hepatitis C: Secondary | ICD-10-CM

## 2019-05-26 ENCOUNTER — Encounter: Payer: Self-pay | Admitting: Internal Medicine

## 2019-08-31 ENCOUNTER — Encounter: Payer: Self-pay | Admitting: Internal Medicine

## 2019-09-17 ENCOUNTER — Encounter: Payer: Self-pay | Admitting: Internal Medicine

## 2019-10-16 ENCOUNTER — Ambulatory Visit (INDEPENDENT_AMBULATORY_CARE_PROVIDER_SITE_OTHER): Payer: No Typology Code available for payment source | Admitting: Internal Medicine

## 2019-10-16 ENCOUNTER — Other Ambulatory Visit: Payer: Self-pay

## 2019-10-16 ENCOUNTER — Encounter: Payer: Self-pay | Admitting: Internal Medicine

## 2019-10-16 VITALS — BP 140/80 | HR 76 | Temp 97.6°F | Ht 59.5 in | Wt 150.8 lb

## 2019-10-16 DIAGNOSIS — K625 Hemorrhage of anus and rectum: Secondary | ICD-10-CM | POA: Diagnosis not present

## 2019-10-16 DIAGNOSIS — Z01818 Encounter for other preprocedural examination: Secondary | ICD-10-CM

## 2019-10-16 DIAGNOSIS — R1310 Dysphagia, unspecified: Secondary | ICD-10-CM

## 2019-10-16 DIAGNOSIS — Z8619 Personal history of other infectious and parasitic diseases: Secondary | ICD-10-CM

## 2019-10-16 DIAGNOSIS — R1319 Other dysphagia: Secondary | ICD-10-CM

## 2019-10-16 DIAGNOSIS — R194 Change in bowel habit: Secondary | ICD-10-CM | POA: Diagnosis not present

## 2019-10-16 DIAGNOSIS — R1011 Right upper quadrant pain: Secondary | ICD-10-CM

## 2019-10-16 DIAGNOSIS — R635 Abnormal weight gain: Secondary | ICD-10-CM

## 2019-10-16 DIAGNOSIS — K5120 Ulcerative (chronic) proctitis without complications: Secondary | ICD-10-CM

## 2019-10-16 NOTE — Progress Notes (Signed)
Dawn Yang 64 y.o. 10-11-1955 [id number]  Assessment & Plan:   Encounter Diagnoses  Name Primary?  . RUQ pain Yes  . Esophageal dysphagia   . Change in bowel habits   . Rectal bleeding   . History of hepatitis C   . Weight gain   . Chronic ulcerative proctitis without complications ([place])   . Encounter for other preprocedural examination     She needs an EGD to evaluate and treat the dysphagia as well as the upper abdominal pain evaluation.  Ultrasound of the abdomen because of the liver history and right upper quadrant pain  Colonoscopy for change in bowel habits rectal bleeding and history of ulcerative proctitis question progression of disease  The risks and benefits as well as alternatives of endoscopic procedure(s) have been discussed and reviewed. All questions answered. The patient agrees to proceed.  I have given her information about low-carb eating and possible intermittent fasting-see AVS  Orders Placed This Encounter  Procedures  . SARS Coronavirus 2 ([facility] ONLY)  . US Abdomen Complete  . Ambulatory referral to Gastroenterology   CC: [name], PA-C   Subjective:   Chief Complaint: Abdominal pain and change in bowel habits  HPI 64 year old woman with a history of chronic ulcerative proctitis diagnosed in 2015, last seen by me for right upper quadrant pain in 2017.  Ultrasound then was negative, primary care have her do hepatic elastography in 2018 and [employer] score was F2 to F3.  She has a history of abnormal transaminases.  She comes today saying "I am as heavy as I have ever been", "my bowels will be loose and diarrhea-like at times and then I will go for days."  She is describing intermittent right flank and right upper quadrant pain that radiates in the epigastrium at times.  No rhyme or reason no clear trigger.  She does note however knots are something she has to avoid now because that upsets her stomach and causes  pain and bloating.  She also has intermittent dysphagia to bread.  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  She knows she could eat better, she has alpha gal and likes meat so she continues to eat some meat from food animals and will get a "slight rash".  Since I last saw her in 2017 she was diagnosed with hepatitis C and was treated with Harvoni and this was eradicated.  She has not had imaging of the liver since then but did not have cirrhosis.  See above.  Last colonoscopy 2015 demonstrated mild chronic ulcerative proctitis.  She does report occasional intermittent rectal bleeding.    Wt Readings from Last 3 Encounters:  10/16/19 150 lb 12.8 oz (68.4 kg)  02/13/16 137 lb (62.1 kg)  03/29/14 148 lb (67.1 kg)    Allergies  Allergen Reactions  . Codeine Nausea And Vomiting  . Morphine And Related Nausea And Vomiting  . Latex Rash   Current Meds  Medication Sig  . b complex vitamins capsule Take 1 capsule by mouth daily.  Marland Kitchen Cod Liver Oil CAPS Take 1 capsule by mouth daily.  . famotidine-calcium carbonate-magnesium hydroxide (PEPCID COMPLETE) 10-800-165 MG chewable tablet Chew 1 tablet by mouth daily as needed.  . Multiple Vitamins-Minerals (MULTIVITAMIN PO) Take 1 tablet by mouth  daily.  . Multiple Vitamins-Minerals (ZINC PO) Take 1 capsule by mouth daily.  . Omega-3 Fatty Acids (OMEGA 3 PO) Take by mouth.  Marland Kitchen VITAMIN D PO Take 1 capsule by mouth daily.   Past Medical History:  Diagnosis Date  . Allergic reaction to alpha-gal   . Chronic ulcerative proctitis without complications ([place]) [id number]  . DVT (deep venous thrombosis) ([place])   . Hepatitis C    eradicated w/ Harvoni  . Osteoporosis    osteopenia  . Ovarian cyst   . Renal cyst    Past Surgical History:  Procedure Laterality Date  . COLONOSCOPY    .  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   Review of Systems  As per HPI all other review of systems negative Objective:   Physical Exam @BP  140/80   Pulse 76   Temp 97.6 F (36.4 C)   Ht 4' 11.5" (1.511 m)   Wt 150 lb 12.8 oz (68.4 kg)   BMI 29.95 kg/m @  General:  Well-developed, well-nourished and in no acute distress Eyes:  anicteric. Neck:   supple w/o thyromegaly or mass.  Lungs: Clear to auscultation bilaterally. Heart:  S1S2, no rubs, murmurs, gallops. Abdomen:  soft, tender RUQ, neg Carnett's, no hepatosplenomegaly, hernia, or mass and BS+.  Rectal: deferred Lymph:  no cervical or supraclavicular adenopathy. Extremities:   no edema, cyanosis or clubbing Skin   no rash. Neuro:  A&O x 3.  Psych:  appropriate mood and  Affect.   Data Reviewed: See HPI October 2020 labs with normal CMET normal CBC alpha gal IgE positive at 1.28 it was 1.8 in 2019, normal vitamin D normal TSH normal lipids  PCP notes February 2021 again normal CMET kidney function normal CBC negative hepatitis panel acute, she does have a positive hep C antibody negative hep C RNA

## 2019-10-16 NOTE — Patient Instructions (Addendum)
You have been scheduled for an abdominal ultrasound at [facility] on 11/01/2019 at 10:00AM. Please arrive 15 minutes prior to your appointment for registration. Make certain not to have anything to eat or drink 6 hours prior to your appointment. Should you need to reschedule your appointment, please contact radiology at [phone]. This test typically takes about 30 minutes to perform.   You have been scheduled for an endoscopy and colonoscopy. Please follow the written instructions given to you at your visit today. Please pick up your prep supplies at the pharmacy within the next 1-3 days. If you use inhalers (even only as needed), please bring them with you on the day of your procedure.   Healthy and nutritious eating and weight loss are made to be harder than they need to be. A simplified diet approach based around eating normally, as much as you want without restricting intake too much is better, I think. You must avoid packaged foods and try to eat real foods as much as possible. Packaged foods, sugary sodas, highly processed foods taste great but are slow poisons that lead to obesity and/or poor health.  It is very helpful to take some time each week and plan your meals. Work with your spouse, partner, family to do this as much as possible. Preparing meals ahead to take to work or school is especially helpful and will save money, too. You can do this and by working together it can take less time.  Another way to help is to order food on-line for pick-up (or delivery if you can afford) and you will avoid impulse buys of unhealthy foods.  Some resources that I like are:  www.gaplesinstitute.org (Do the learning modules about healthy eating)  www.dietdoctor.com - helps with low carb diets and also can learn about and consider intermittent  fasting. If you have diabetes would not do intermittent fasting without checking with your doctor. Best to change what you eat before doing  this.  Avoid all processed and packaged foods (bread, pasta, crackers, chips, etc) and beverages containing calories.  Avoid added sugars and excessive natural sugars. Minimize or avoid artificial sweeteners.  I appreciate the opportunity to care for you. Silvano Rusk, MD, [facility]

## 2019-10-29 ENCOUNTER — Telehealth: Payer: Self-pay | Admitting: Internal Medicine

## 2019-10-29 NOTE — Telephone Encounter (Signed)
Appt has been cancelled with GI

## 2019-11-01 ENCOUNTER — Other Ambulatory Visit: Payer: No Typology Code available for payment source

## 2019-11-27 ENCOUNTER — Encounter: Payer: No Typology Code available for payment source | Admitting: Internal Medicine

## 2020-04-28 DIAGNOSIS — R69 Illness, unspecified: Secondary | ICD-10-CM | POA: Diagnosis not present

## 2020-05-01 DIAGNOSIS — R69 Illness, unspecified: Secondary | ICD-10-CM | POA: Diagnosis not present

## 2020-05-22 DIAGNOSIS — L209 Atopic dermatitis, unspecified: Secondary | ICD-10-CM | POA: Diagnosis not present

## 2020-05-22 DIAGNOSIS — Z13220 Encounter for screening for lipoid disorders: Secondary | ICD-10-CM | POA: Diagnosis not present

## 2020-05-22 DIAGNOSIS — G4700 Insomnia, unspecified: Secondary | ICD-10-CM | POA: Diagnosis not present

## 2020-05-22 DIAGNOSIS — Z0000 Encounter for general adult medical examination without abnormal findings: Secondary | ICD-10-CM | POA: Diagnosis not present

## 2020-05-22 DIAGNOSIS — Z6828 Body mass index (BMI) 28.0-28.9, adult: Secondary | ICD-10-CM | POA: Diagnosis not present

## 2020-05-22 DIAGNOSIS — E559 Vitamin D deficiency, unspecified: Secondary | ICD-10-CM | POA: Diagnosis not present

## 2020-05-22 DIAGNOSIS — Z1329 Encounter for screening for other suspected endocrine disorder: Secondary | ICD-10-CM | POA: Diagnosis not present

## 2020-05-29 ENCOUNTER — Other Ambulatory Visit: Payer: Self-pay | Admitting: Physician Assistant

## 2020-05-29 DIAGNOSIS — Z136 Encounter for screening for cardiovascular disorders: Secondary | ICD-10-CM

## 2020-05-29 DIAGNOSIS — M810 Age-related osteoporosis without current pathological fracture: Secondary | ICD-10-CM

## 2020-09-09 ENCOUNTER — Other Ambulatory Visit: Payer: Self-pay

## 2020-10-16 DIAGNOSIS — S8002XA Contusion of left knee, initial encounter: Secondary | ICD-10-CM | POA: Diagnosis not present

## 2020-10-16 DIAGNOSIS — S40021A Contusion of right upper arm, initial encounter: Secondary | ICD-10-CM | POA: Diagnosis not present

## 2020-10-30 DIAGNOSIS — M25562 Pain in left knee: Secondary | ICD-10-CM | POA: Diagnosis not present

## 2020-10-30 DIAGNOSIS — M79601 Pain in right arm: Secondary | ICD-10-CM | POA: Diagnosis not present

## 2020-10-30 DIAGNOSIS — M533 Sacrococcygeal disorders, not elsewhere classified: Secondary | ICD-10-CM | POA: Diagnosis not present

## 2020-11-05 DIAGNOSIS — E559 Vitamin D deficiency, unspecified: Secondary | ICD-10-CM | POA: Diagnosis not present

## 2020-11-05 DIAGNOSIS — G4700 Insomnia, unspecified: Secondary | ICD-10-CM | POA: Diagnosis not present

## 2020-11-05 DIAGNOSIS — Z209 Contact with and (suspected) exposure to unspecified communicable disease: Secondary | ICD-10-CM | POA: Diagnosis not present

## 2020-11-05 DIAGNOSIS — Z13220 Encounter for screening for lipoid disorders: Secondary | ICD-10-CM | POA: Diagnosis not present

## 2020-11-05 DIAGNOSIS — B001 Herpesviral vesicular dermatitis: Secondary | ICD-10-CM | POA: Diagnosis not present

## 2020-11-05 DIAGNOSIS — Z6828 Body mass index (BMI) 28.0-28.9, adult: Secondary | ICD-10-CM | POA: Diagnosis not present

## 2021-02-06 ENCOUNTER — Inpatient Hospital Stay: Admission: RE | Admit: 2021-02-06 | Payer: Self-pay | Source: Ambulatory Visit

## 2021-03-26 ENCOUNTER — Encounter: Payer: Self-pay | Admitting: Internal Medicine

## 2021-03-31 ENCOUNTER — Ambulatory Visit: Payer: Medicare HMO | Admitting: Internal Medicine

## 2021-03-31 ENCOUNTER — Other Ambulatory Visit (INDEPENDENT_AMBULATORY_CARE_PROVIDER_SITE_OTHER): Payer: Medicare HMO

## 2021-03-31 ENCOUNTER — Encounter: Payer: Self-pay | Admitting: Internal Medicine

## 2021-03-31 VITALS — BP 120/70 | HR 80 | Ht 59.5 in | Wt 148.0 lb

## 2021-03-31 DIAGNOSIS — R1011 Right upper quadrant pain: Secondary | ICD-10-CM

## 2021-03-31 DIAGNOSIS — Z8619 Personal history of other infectious and parasitic diseases: Secondary | ICD-10-CM

## 2021-03-31 DIAGNOSIS — K625 Hemorrhage of anus and rectum: Secondary | ICD-10-CM

## 2021-03-31 DIAGNOSIS — R194 Change in bowel habit: Secondary | ICD-10-CM | POA: Diagnosis not present

## 2021-03-31 DIAGNOSIS — Z91018 Allergy to other foods: Secondary | ICD-10-CM | POA: Diagnosis not present

## 2021-03-31 DIAGNOSIS — K51218 Ulcerative (chronic) proctitis with other complication: Secondary | ICD-10-CM

## 2021-03-31 LAB — CBC WITH DIFFERENTIAL/PLATELET
Basophils Absolute: 0 10*3/uL (ref 0.0–0.1)
Basophils Relative: 0.7 % (ref 0.0–3.0)
Eosinophils Absolute: 0.1 10*3/uL (ref 0.0–0.7)
Eosinophils Relative: 2.5 % (ref 0.0–5.0)
HCT: 40.5 % (ref 36.0–46.0)
Hemoglobin: 13.3 g/dL (ref 12.0–15.0)
Lymphocytes Relative: 30.4 % (ref 12.0–46.0)
Lymphs Abs: 1.6 10*3/uL (ref 0.7–4.0)
MCHC: 32.8 g/dL (ref 30.0–36.0)
MCV: 89.9 fl (ref 78.0–100.0)
Monocytes Absolute: 0.6 10*3/uL (ref 0.1–1.0)
Monocytes Relative: 10.8 % (ref 3.0–12.0)
Neutro Abs: 3 10*3/uL (ref 1.4–7.7)
Neutrophils Relative %: 55.6 % (ref 43.0–77.0)
Platelets: 214 10*3/uL (ref 150.0–400.0)
RBC: 4.51 Mil/uL (ref 3.87–5.11)
RDW: 13 % (ref 11.5–15.5)
WBC: 5.4 10*3/uL (ref 4.0–10.5)

## 2021-03-31 LAB — COMPREHENSIVE METABOLIC PANEL
ALT: 15 U/L (ref 0–35)
AST: 17 U/L (ref 0–37)
Albumin: 4.3 g/dL (ref 3.5–5.2)
Alkaline Phosphatase: 72 U/L (ref 39–117)
BUN: 15 mg/dL (ref 6–23)
CO2: 25 mEq/L (ref 19–32)
Calcium: 9.9 mg/dL (ref 8.4–10.5)
Chloride: 104 mEq/L (ref 96–112)
Creatinine, Ser: 0.66 mg/dL (ref 0.40–1.20)
GFR: 91.73 mL/min (ref >60.00)
Glucose, Bld: 104 mg/dL — ABNORMAL HIGH (ref 70–99)
Potassium: 3.9 mEq/L (ref 3.5–5.1)
Sodium: 136 mEq/L (ref 135–145)
Total Bilirubin: 0.4 mg/dL (ref 0.2–1.2)
Total Protein: 7.8 g/dL (ref 6.0–8.3)

## 2021-03-31 NOTE — Progress Notes (Signed)
Dawn Yang [employer] 65 y.o. Nov 27, 1955 [id number]  Assessment & Plan:   Encounter Diagnoses  Name Primary?   Change in bowel habit Yes   Rectal bleeding    RUQ pain    Chronic ulcerative proctitis with other complication (HCC)    History of hepatitis C    Allergy to alpha-gal      Schedule colonoscopy and right upper quadrant ultrasound. Labs to include CMET and CBC  The risks and benefits as well as alternatives of endoscopic procedure(s) have been discussed and reviewed. All questions answered. The patient agrees to proceed.  Follow-up regarding alpha gal issues when she returns not discussed today  Note she is tender in the right rib area infra anterior and lateral and I think that is her pain most likely though certainly could be fatty liver and inflammatory changes associated  Agree with alcohol cessation at this time  I appreciate the opportunity to care for this patient. CC: Dawn Yang, [place]    Subjective:   Chief Complaint: Constipation diarrhea rectal bleeding and right upper quadrant pain  HPI The patient is a 65 year old woman with a history of chronic ulcerative proctitis last seen in the spring 2021 with right upper quadrant pain some mild esophageal dysphagia and change in bowel habits.  I had recommended ultrasound, EGD and colonoscopy at that time.  She did not follow through with those, her symptoms improved.  However in early July she started having right upper quadrant pain that is a severe ache, change in bowel habits with some constipation and globs of mucus versus fat like substance coming out and some rectal bleeding, she feels swollen in the right upper quadrant or distended there as well.  She is also had some diarrhea so alternating bowel habits.  She stopped drinking red wine she was drinking about a glass a night.  She does admit to eating dairy cheese milk products.  Hemorrhoids had swollen and were inflamed and irritated in  early July but are better.  She is eating some grass fed beef as well.  Apparently alpha gal not some much of an issue now.  Transaminases were normal in this spring, cholesterol was "good" last CBC was in 2021.  Recall that she has a history of hepatitis C that was treated.  Eradicated. Allergies  Allergen Reactions   Codeine Nausea And Vomiting   Morphine And Related Nausea And Vomiting   Alpha-Gal Rash    If she eats red meat   Latex Rash   Current Meds  Medication Sig   b complex vitamins capsule Take 1 capsule by mouth daily.   Cod Liver Oil CAPS Take 1 capsule by mouth daily.   famotidine-calcium carbonate-magnesium hydroxide (PEPCID COMPLETE) 10-800-165 MG chewable tablet Chew 1 tablet by mouth daily as needed.   Multiple Vitamins-Minerals (MULTIVITAMIN PO) Take 1 tablet by mouth daily.   Multiple Vitamins-Minerals (ZINC PO) Take 1 capsule by mouth daily.   Omega-3 Fatty Acids (OMEGA 3 PO) Take by mouth.   VITAMIN D PO Take 1 capsule by mouth daily.   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   Review of Systems As per HPI  Objective:   Physical Exam BP 120/70   Pulse 80   Ht 4' 11.5" (1.511 m)   Wt 148 lb (67.1 kg)   SpO2 97%   BMI 29.39 kg/m  Well-developed well-nourished  white woman in no acute distress Lung fields are clear Normal S1-S2 no rubs murmurs or gallops The abdomen is somewhat obese soft nontender the ribs are tender right side inferior anterior and lateral mild to moderate. There is no hepatomegaly or mass no splenomegaly.  Bowel sounds are present.  Dawn Yang, CMA present  Rectal exam nontender no mass scanty brown stool present. Anoderm with minimal swelling of external hemorrhoids.

## 2021-03-31 NOTE — Patient Instructions (Addendum)
If you are age 66 or older, your body mass index should be between 23-30. Your Body mass index is 29.39 kg/m. If this is out of the aforementioned range listed, please consider follow up with your Primary Care Provider.  If you are age 75 or younger, your body mass index should be between 19-25. Your Body mass index is 29.39 kg/m. If this is out of the aformentioned range listed, please consider follow up with your Primary Care Provider.   __________________________________________________________  The [facility] providers would like to encourage you to use [facility] to communicate with providers for non-urgent requests or questions.  Due to long hold times on the telephone, sending your provider a message by [facility] may be a faster and more efficient way to get a response.  Please allow 48 business hours for a response.  Please remember that this is for non-urgent requests.   Your provider has requested that you go to the basement level for lab work before leaving today. Press "B" on the elevator. The lab is located at the first door on the left as you exit the elevator.  You have been scheduled for a colonoscopy. Please follow written instructions given to you at your visit today.  Please pick up your prep supplies at the pharmacy within the next 1-3 days. If you use inhalers (even only as needed), please bring them with you on the day of your procedure.   You have been scheduled for an abdominal ultrasound at ____________________on _________ at _______________. Please arrive 15 minutes prior to your appointment for registration. Make certain not to have anything to eat or drink 6 hours prior to your appointment. Should you need to reschedule your appointment, please contact radiology at [phone]. This test typically takes about 30 minutes to perform.   I appreciate the opportunity to care for you. Silvano Rusk, MD, [facility]

## 2021-04-14 ENCOUNTER — Other Ambulatory Visit: Payer: Self-pay

## 2021-04-14 ENCOUNTER — Ambulatory Visit (HOSPITAL_COMMUNITY)
Admission: RE | Admit: 2021-04-14 | Discharge: 2021-04-14 | Disposition: A | Discharge status: Home / Self Care | Payer: Medicare HMO | Source: Ambulatory Visit | Attending: Internal Medicine | Admitting: Internal Medicine

## 2021-04-14 DIAGNOSIS — R1011 Right upper quadrant pain: Secondary | ICD-10-CM | POA: Diagnosis not present

## 2021-04-14 DIAGNOSIS — K8020 Calculus of gallbladder without cholecystitis without obstruction: Secondary | ICD-10-CM | POA: Diagnosis not present

## 2021-04-15 ENCOUNTER — Encounter: Payer: Self-pay | Admitting: Internal Medicine

## 2021-04-15 ENCOUNTER — Other Ambulatory Visit: Payer: Self-pay

## 2021-04-15 DIAGNOSIS — K8020 Calculus of gallbladder without cholecystitis without obstruction: Secondary | ICD-10-CM

## 2021-04-15 DIAGNOSIS — K838 Other specified diseases of biliary tract: Secondary | ICD-10-CM

## 2021-04-15 HISTORY — DX: Calculus of gallbladder without cholecystitis without obstruction: K80.20

## 2021-04-16 ENCOUNTER — Telehealth: Payer: Self-pay | Admitting: Internal Medicine

## 2021-04-16 NOTE — Telephone Encounter (Signed)
Patient is returning your call.  

## 2021-04-16 NOTE — Telephone Encounter (Signed)
Pt returned your call. She requested that when you call her back to please leave a detailed message if she cannot answer her phone.

## 2021-04-16 NOTE — Telephone Encounter (Signed)
See results notes for details.  I left a message with the details as patient requested.

## 2021-04-16 NOTE — Telephone Encounter (Signed)
Returned pt call as requested. LVM requesting returned call. Intended to inform about MRI orders and referral. Appears she is scheduled for MRI. Will inform about referral upon her returned call.

## 2021-05-01 ENCOUNTER — Ambulatory Visit (HOSPITAL_COMMUNITY)
Admission: RE | Admit: 2021-05-01 | Discharge: 2021-05-01 | Disposition: A | Discharge status: Home / Self Care | Payer: Medicare HMO | Source: Ambulatory Visit | Attending: Internal Medicine | Admitting: Internal Medicine

## 2021-05-01 ENCOUNTER — Other Ambulatory Visit: Payer: Self-pay

## 2021-05-01 ENCOUNTER — Other Ambulatory Visit (HOSPITAL_COMMUNITY): Payer: Self-pay | Admitting: Internal Medicine

## 2021-05-01 DIAGNOSIS — K8020 Calculus of gallbladder without cholecystitis without obstruction: Secondary | ICD-10-CM | POA: Diagnosis not present

## 2021-05-01 DIAGNOSIS — K838 Other specified diseases of biliary tract: Secondary | ICD-10-CM | POA: Insufficient documentation

## 2021-05-01 DIAGNOSIS — D1803 Hemangioma of intra-abdominal structures: Secondary | ICD-10-CM | POA: Diagnosis not present

## 2021-05-01 DIAGNOSIS — N281 Cyst of kidney, acquired: Secondary | ICD-10-CM | POA: Diagnosis not present

## 2021-05-01 DIAGNOSIS — M419 Scoliosis, unspecified: Secondary | ICD-10-CM | POA: Diagnosis not present

## 2021-05-01 MED ORDER — GADOBUTROL 1 MMOL/ML IV SOLN
6.0000 mL | Freq: Once | INTRAVENOUS | Status: AC | PRN
  Administered 2021-05-01: 6 mL via INTRAVENOUS

## 2021-05-07 ENCOUNTER — Encounter: Payer: Self-pay | Admitting: Internal Medicine

## 2021-05-07 ENCOUNTER — Ambulatory Visit (AMBULATORY_SURGERY_CENTER): Payer: Medicare HMO | Admitting: Internal Medicine

## 2021-05-07 VITALS — BP 125/81 | HR 76 | Temp 97.5°F | Resp 11 | Ht 59.0 in | Wt 148.0 lb

## 2021-05-07 DIAGNOSIS — K5730 Diverticulosis of large intestine without perforation or abscess without bleeding: Secondary | ICD-10-CM

## 2021-05-07 DIAGNOSIS — R194 Change in bowel habit: Secondary | ICD-10-CM | POA: Diagnosis not present

## 2021-05-07 DIAGNOSIS — K51011 Ulcerative (chronic) pancolitis with rectal bleeding: Secondary | ICD-10-CM

## 2021-05-07 DIAGNOSIS — K6389 Other specified diseases of intestine: Secondary | ICD-10-CM | POA: Diagnosis not present

## 2021-05-07 DIAGNOSIS — K6289 Other specified diseases of anus and rectum: Secondary | ICD-10-CM | POA: Diagnosis not present

## 2021-05-07 MED ORDER — SODIUM CHLORIDE 0.9 % IV SOLN: 500.0000 mL | Freq: Once | INTRAVENOUS | Status: DC

## 2021-05-07 NOTE — Progress Notes (Signed)
[facility] History and Physical   Primary Care Physician:  [name], PA-C   Reason for Procedure:   Change in bowel habits  Plan:    colonoscopy     HPI: Dawn Yang is a 65 y.o. female  with a history of chronic ulcerative proctitis last seen in the spring 2021 with right upper quadrant pain some mild esophageal dysphagia and change in bowel habits.  I had recommended ultrasound, EGD and colonoscopy at that time.  She did not follow through with those, her symptoms improved.  However in early July she started having right upper quadrant pain that is a severe ache, change in bowel habits with some constipation and globs of mucus versus fat like substance coming out and some rectal bleeding, she feels swollen in the right upper quadrant or distended there as well.  She is also had some diarrhea so alternating bowel habits.  She stopped drinking red wine she was drinking about a glass a night.  She does admit to eating dairy cheese milk products.  Hemorrhoids had swollen and were inflamed and irritated in early July but are better.  She is eating some grass fed beef as well.  Apparently alpha gal not some much of an issue now.   Transaminases were normal in this spring, cholesterol was "good" last CBC was in 2021.  Recall that she has a history of hepatitis C that was treated.  Eradicated.   Past Medical History:  Diagnosis Date   Allergic reaction to alpha-gal    Cholelithiasis 04/15/2021   Chronic ulcerative proctitis without complications ([facility]) 3/64/6803   DVT (deep venous thrombosis) ([facility])    Hepatitis C    eradicated w/ Harvoni   Osteoporosis    osteopenia   Ovarian cyst    Renal cyst     Past Surgical History:  Procedure Laterality Date   COLONOSCOPY     PARTIAL HYSTERECTOMY      Prior to Admission medications   Medication Sig Start Date End Date Taking? Authorizing Provider  b complex vitamins capsule Take 1 capsule by mouth daily.    Yes [provider]  famotidine-calcium carbonate-magnesium hydroxide (PEPCID COMPLETE) 10-800-165 MG chewable tablet Chew 1 tablet by mouth daily as needed.   Yes [provider]  Multiple Vitamins-Minerals (MULTIVITAMIN PO) Take 1 tablet by mouth daily.   Yes [provider]  Multiple Vitamins-Minerals (ZINC PO) Take 1 capsule by mouth daily.   Yes [provider]  Omega-3 Fatty Acids (OMEGA 3 PO) Take by mouth.   Yes [provider]  VITAMIN D PO Take 1 capsule by mouth daily.   Yes [provider]  Cod Liver Oil CAPS Take 1 capsule by mouth daily. Patient not taking: Reported on 05/07/2021    [provider]    Current Outpatient Medications  Medication Sig Dispense Refill   b complex vitamins capsule Take 1 capsule by mouth daily.     famotidine-calcium carbonate-magnesium hydroxide (PEPCID COMPLETE) 10-800-165 MG chewable tablet Chew 1 tablet by mouth daily as needed.     Multiple Vitamins-Minerals (MULTIVITAMIN PO) Take 1 tablet by mouth daily.     Multiple Vitamins-Minerals (ZINC PO) Take 1 capsule by mouth daily.     Omega-3 Fatty Acids (OMEGA 3 PO) Take by mouth.     VITAMIN D PO Take 1 capsule by mouth daily.     Cod Liver Oil CAPS Take 1 capsule by mouth daily. (Patient not taking: Reported on 05/07/2021)  Current Facility-Administered Medications  Medication Dose Route Frequency Provider Last Rate Last Admin   0.9 %  sodium chloride infusion  500 mL Intravenous Once Gatha Mayer, MD        Allergies as of 05/07/2021 - Review Complete 05/07/2021  Allergen Reaction Noted   Codeine Nausea And Vomiting 03/15/2014   Morphine and related Nausea And Vomiting 03/15/2014   Alpha-gal Rash 03/26/2021   Latex Rash 10/16/2019    Family History  Problem Relation Age of Onset   Kidney disease Father    Diabetes Father    Gout Father    Gallbladder disease Father    Diabetes Paternal Grandmother    Colon  polyps Sister    Gallbladder disease Sister    Colon cancer Neg Hx     Social History   Socioeconomic History   Marital status: Married    Spouse name: Not on file   Number of children: Not on file   Years of education: Not on file   Highest education level: Not on file  Occupational History   Not on file  Tobacco Use   Smoking status: Former   Smokeless tobacco: Never  Substance and Sexual Activity   Alcohol use: Yes    Alcohol/week: 2.0 standard drinks    Types: 2 Glasses of wine per week   Drug use: Not on file   Sexual activity: Not on file  Other Topics Concern   Not on file  Social History Narrative   Married   Garment/textile technologist   Social/occasional alcohol 2 to 4 glasses of wine a week   No drug use   Former smoker   Social Determinants of Radio broadcast assistant Strain: Not on file  Food Insecurity: Not on file  Transportation Needs: Not on file  Physical Activity: Not on file  Stress: Not on file  Social Connections: Not on file  Intimate Partner Violence: Not on file    Review of Systems:  All other review of systems negative except as mentioned in the HPI.  Physical Exam: Vital signs BP 95/77   Pulse 85   Temp (!) 97.5 F (36.4 C)   Ht 4\' 11"  (1.499 m)   Wt 148 lb (67.1 kg)   SpO2 98%   BMI 29.89 kg/m   General:   Alert,  Well-developed, well-nourished, pleasant and cooperative in NAD Lungs:  Clear throughout to auscultation.   Heart:  Regular rate and rhythm; no murmurs, clicks, rubs,  or gallops. Abdomen:  Soft, nontender and nondistended. Normal bowel sounds.   Neuro/Psych:  Alert and cooperative. Normal mood and affect. A and O x 3   @Ambika Zettlemoyer  Simonne Maffucci, MD, [facility] [phone] (pager) 05/07/2021 3:53 PM@

## 2021-05-07 NOTE — Progress Notes (Signed)
To PACU, VSS. Report to [email or web address] 

## 2021-05-07 NOTE — Op Note (Signed)
[facility] Patient Name: Dawn Yang Procedure Date: 05/07/2021 3:57 PM MRN: [id number] Endoscopist: Gatha Mayer , MD Age: 65 Referring MD:  Date of Birth: Jun 24, 1956 Gender: Female Account #: [id number] Procedure:                Colonoscopy Indications:              Chronic ulcerative proctitis, Follow-up of chronic                            ulcerative proctitis, Change in bowel habits Medicines:                Propofol per Anesthesia, Monitored Anesthesia Care Procedure:                Pre-Anesthesia Assessment:                           - Prior to the procedure, a History and Physical                            was performed, and patient medications and                            allergies were reviewed. The patient's tolerance of                            previous anesthesia was also reviewed. The risks                            and benefits of the procedure and the sedation                            options and risks were discussed with the patient.                            All questions were answered, and informed consent                            was obtained. Prior Anticoagulants: The patient has                            taken no previous anticoagulant or antiplatelet                            agents. ASA Grade Assessment: II - A patient with                            mild systemic disease. After reviewing the risks                            and benefits, the patient was deemed in                            satisfactory condition to undergo the procedure.  After obtaining informed consent, the colonoscope                            was passed under direct vision. Throughout the                            procedure, the patient's blood pressure, pulse, and                            oxygen saturations were monitored continuously. The                            CF [id number] #[id number] was introduced through the anus                             and advanced to the the terminal ileum, with                            identification of the appendiceal orifice and IC                            valve. The colonoscopy was performed without                            difficulty. The patient tolerated the procedure                            well. The quality of the bowel preparation was                            good. The terminal ileum, ileocecal valve,                            appendiceal orifice, and rectum were photographed.                            The bowel preparation used was Miralax via split                            dose instruction. Scope In: 4:07:57 PM Scope Out: 4:20:32 PM Scope Withdrawal Time: 0 hours 8 minutes 14 seconds  Total Procedure Duration: 0 hours 12 minutes 35 seconds  Findings:                 The perianal and digital rectal examinations were                            normal.                           Inflammation characterized by congestion (edema),                            erythema, friability and aphthous ulcerations was  found in a continuous and circumferential pattern                            in the rectum. This was mild in severity. Biopsies                            were taken with a cold forceps for histology.                            Verification of patient identification for the                            specimen was done. Estimated blood loss was minimal.                           The sigmoid colon, descending colon, transverse                            colon, ascending colon and cecum appeared normal.                            Biopsies for histology were taken with a cold                            forceps from the right colon and left colon for                            evaluation of microscopic colitis. Verification of                            patient identification for the specimen was done.                            Estimated blood  loss was minimal.                           A few diverticula were found in the sigmoid colon.                           The exam was otherwise without abnormality on                            direct and retroflexion views. Complications:            No immediate complications. Estimated Blood Loss:     Estimated blood loss was minimal. Impression:               - Proctitis ulcerative colitis. Inflammation was                            found in the rectum. This was mild in severity.                            Biopsied.                           -  The sigmoid colon, descending colon, transverse                            colon, ascending colon and cecum have normal                            mucosa. Biopsied.                           - Diverticulosis in the sigmoid colon.                           - The examination was otherwise normal on direct                            and retroflexion views. Recommendation:           - Patient has a contact number available for                            emergencies. The signs and symptoms of potential                            delayed complications were discussed with the                            patient. Return to normal activities tomorrow.                            Written discharge instructions were provided to the                            patient.                           - Resume previous diet.                           - Continue present medications.                           - Await pathology results.                           - Repeat colonoscopy is recommended. The                            colonoscopy date will be determined after pathology                            results from today's exam become available for                            review. Gatha Mayer, MD 05/07/2021 4:30:53 PM This report has been signed electronically.

## 2021-05-07 NOTE — Progress Notes (Signed)
Called to room to assist during endoscopic procedure.  Patient ID and intended procedure confirmed with present staff. Received instructions for my participation in the procedure from the performing physician.  

## 2021-05-07 NOTE — Patient Instructions (Addendum)
The proctitis is mildly active, I think. This can explain the mucous and blood you saw. I took biopsies and will contact you with results and plans.  We discussed the MRI - + gallstones, no good evidence for any stone(s) in bile duct though thought possible (but unlikely). We will see what Dr. Georgette Dover thinks.  I appreciate the opportunity to care for you. Gatha Mayer, MD, [facility]  Handouts given on diverticulosis and ulcerative colitis. Await pathology results. Repeat colonoscopy for surveillance will be determined based off of pathology results.   YOU HAD AN ENDOSCOPIC PROCEDURE TODAY AT [facility]:   Refer to the procedure report that was given to you for any specific questions about what was found during the examination.  If the procedure report does not answer your questions, please call your gastroenterologist to clarify.  If you requested that your care partner not be given the details of your procedure findings, then the procedure report has been included in a sealed envelope for you to review at your convenience later.  YOU SHOULD EXPECT: Some feelings of bloating in the abdomen. Passage of more gas than usual.  Walking can help get rid of the air that was put into your GI tract during the procedure and reduce the bloating. If you had a lower endoscopy (such as a colonoscopy or flexible sigmoidoscopy) you may notice spotting of blood in your stool or on the toilet paper. If you underwent a bowel prep for your procedure, you may not have a normal bowel movement for a few days.  Please Note:  You might notice some irritation and congestion in your nose or some drainage.  This is from the oxygen used during your procedure.  There is no need for concern and it should clear up in a day or so.  SYMPTOMS TO REPORT IMMEDIATELY:  Following lower endoscopy (colonoscopy or flexible sigmoidoscopy):  Excessive amounts of blood in the stool  Significant tenderness or worsening of  abdominal pains  Swelling of the abdomen that is new, acute  Fever of 100F or higher  For urgent or emergent issues, a gastroenterologist can be reached at any hour by calling [phone]. Do not use MyChart messaging for urgent concerns.    DIET:  We do recommend a small meal at first, but then you may proceed to your regular diet.  Drink plenty of fluids but you should avoid alcoholic beverages for 24 hours.  ACTIVITY:  You should plan to take it easy for the rest of today and you should NOT DRIVE or use heavy machinery until tomorrow (because of the sedation medicines used during the test).    FOLLOW UP: Our staff will call the number listed on your records 48-72 hours following your procedure to check on you and address any questions or concerns that you may have regarding the information given to you following your procedure. If we do not reach you, we will leave a message.  We will attempt to reach you two times.  During this call, we will ask if you have developed any symptoms of COVID 19. If you develop any symptoms (ie: fever, flu-like symptoms, shortness of breath, cough etc.) before then, please call [phone].  If you test positive for Covid 19 in the 2 weeks post procedure, please call and report this information to [place].    If any biopsies were taken you will be contacted by phone or by letter within the next 1-3 weeks.  Please call  us at [phone] if you have not heard about the biopsies in 3 weeks.    SIGNATURES/CONFIDENTIALITY: You and/or your care partner have signed paperwork which will be entered into your electronic medical record.  These signatures attest to the fact that that the information above on your After Visit Summary has been reviewed and is understood.  Full responsibility of the confidentiality of this discharge information lies with you and/or your care-partner.

## 2021-05-11 ENCOUNTER — Telehealth: Payer: Self-pay

## 2021-05-11 NOTE — Telephone Encounter (Signed)
Attempted to reach patient for post-procedure f/u call. No answer. Left message that staff will make another attempt to reach her later today and for her to please not hesitate to call us if she has any questions/concerns regarding her care.

## 2021-05-11 NOTE — Telephone Encounter (Signed)
Attempted to reach pt. With follow-up call following endoscopic procedure 05/07/2021. LM on pt. Voice mail to call if she has any questions or concerns.

## 2021-05-18 ENCOUNTER — Other Ambulatory Visit: Payer: Self-pay

## 2021-05-18 ENCOUNTER — Ambulatory Visit: Payer: Self-pay | Admitting: Surgery

## 2021-05-18 DIAGNOSIS — K811 Chronic cholecystitis: Secondary | ICD-10-CM | POA: Diagnosis not present

## 2021-05-18 MED ORDER — MESALAMINE 1000 MG RE SUPP: 1000.0000 mg | Freq: Every day | RECTAL | 3 refills | Status: DC

## 2021-05-18 NOTE — H&P (Signed)
Subjective   Chief Complaint: New Consultation (Gallbladder)     History of Present Illness: Dawn Yang is a 65 y.o. female who is seen today as an office consultation at the request of Dr. Carlean Purl for evaluation of New Consultation (Gallbladder) .    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  She did have some change in her bowel habits as well as noticing some blood and mucus with bowel movements.  This year, her symptoms became more frequent and severe.  She feels discomfort and swelling in her right upper quadrant.  The patient does enjoy eating cheese as well as red meat.  These tend to exacerbate her symptoms.  Colonoscopy on 05/07/2021 showed signs of ulcerative proctitis, mild diverticulosis, but otherwise no significant findings.  Ultrasound showed multiple gallstones with a mildly distended common bile duct.  Liver function tests were normal.  MRCP was obtained that showed no obvious choledocholithiasis but there was "truncation of the distal CBD" that might represent a small distal CBD stone.  She presents now to discuss cholecystectomy with cholangiogram.   Review of Systems: A complete review of systems was obtained from the patient.  I have reviewed this information and discussed as appropriate with the patient.  See HPI as well for other ROS.  Review of Systems  Constitutional: Negative.   HENT: Negative.   Eyes: Negative.   Respiratory: Negative.   Cardiovascular: Negative.   Gastrointestinal: Positive for abdominal pain, blood in stool, diarrhea and nausea.  Genitourinary: Positive for flank pain.  Skin: Negative.   Neurological: Negative.   Endo/Heme/Allergies: Negative.   Psychiatric/Behavioral: Negative.       Medical History: Past Medical History:  Diagnosis Date   DVT (deep venous thrombosis) (CMS-HCC)     Patient Active Problem  List  Diagnosis   Dilated bile duct   Chronic ulcerative proctitis without complications (CMS-HCC)    Past Surgical History:  Procedure Laterality Date   HYSTERECTOMY VAGINAL LAPAROSCOPIC ASSISTED       Allergies  Allergen Reactions   Codeine Nausea And Vomiting   Latex Rash    Current Outpatient Medications on File Prior to Visit  Medication Sig Dispense Refill   cholecalciferol (VITAMIN D3) 10 mcg (400 unit) chewable tablet Take 400 Units by mouth once daily     cod liver oiL Cap Take 1 capsule by mouth once daily     multivitamin with minerals tablet Take by mouth     No current facility-administered medications on file prior to visit.    Family History  Problem Relation Age of Onset   Diabetes Father    High blood pressure (Hypertension) Father    Hyperlipidemia (Elevated cholesterol) Father    Coronary Artery Disease (Blocked arteries around heart) Father      Social History   Tobacco Use  Smoking Status Former Smoker  Smokeless Tobacco Never Used     Social History   Socioeconomic History   Marital status: Married  Tobacco Use   Smoking status: Former Smoker   Smokeless tobacco: Never Used  Scientific laboratory technician Use: Never used  Substance and Sexual Activity   Alcohol use: Yes    Comment: occassional   Drug use: Never    Objective:    Vitals:   05/18/21 0954  Pulse: 70  Weight: 66.6 kg (146 lb 12.8 oz)  Height: 152.4 cm (5')  Body mass index is 28.67 kg/m.  Physical Exam   Constitutional:  WDWN in NAD, conversant, no obvious deformities; lying in bed comfortably Eyes:  Pupils equal, round; sclera anicteric; moist conjunctiva; no lid lag HENT:  Oral mucosa moist; good dentition  Neck:  No masses palpated, trachea midline; no thyromegaly Lungs:  CTA bilaterally; normal respiratory effort CV:  Regular rate and rhythm; no murmurs; extremities well-perfused with no edema Abd:  +bowel sounds, soft, mild right upper quadrant tenderness, no  palpable organomegaly; no palpable hernias, healed umbilical incision Musc:  Unable to assess gait; no apparent clubbing or cyanosis in extremities Lymphatic:  No palpable cervical or axillary lymphadenopathy Skin:  Warm, dry; no sign of jaundice Psychiatric - alert and oriented x 4; calm mood and affect   Labs, Imaging and Diagnostic Testing: CLINICAL DATA:  Right upper quadrant pain.   EXAM: ULTRASOUND ABDOMEN LIMITED RIGHT UPPER QUADRANT   COMPARISON:  Ultrasound 06/07/2017.   FINDINGS: Gallbladder:   Multiple gallstones noted. Largest gallstone measures approximately 8 mm. Gallbladder wall thickness 2.9 mm. Negative Murphy sign.   Common bile duct:   Diameter: 7.4 mm   Liver:   No focal lesion identified. Within normal limits in parenchymal echogenicity. Portal vein is patent on color Doppler imaging with normal direction of blood flow towards the liver.   Other: None.   IMPRESSION: 1. Multiple gall stones, largest measuring approximately 8 mm. Gallbladder wall thickness 2.9 mm. Negative Murphy sign.   2. Mildly distended common bile duct at 7.4 mm. No obstructing lesion is identified. If further evaluation is needed MRI of the liver with MRCP can be obtained.     Electronically Signed   By: Marcello Moores  Register M.D.   On: 04/15/2021 07:50  CLINICAL DATA:  Recent right upper quadrant pain, gallstones on ultrasound with potential mild prominence of the common bile duct.   EXAM: MRI ABDOMEN WITHOUT AND WITH CONTRAST (INCLUDING MRCP)   TECHNIQUE: Multiplanar multisequence MR imaging of the abdomen was performed both before and after the administration of intravenous contrast. Heavily T2-weighted images of the biliary and pancreatic ducts were obtained, and three-dimensional MRCP images were rendered by post processing.   CONTRAST:  1mL GADAVIST GADOBUTROL 1 MMOL/ML IV SOLN   COMPARISON:  04/14/2021   FINDINGS: Body habitus reduces diagnostic sensitivity  and specificity.   Lower chest: Mild cardiomegaly.   Hepatobiliary: There are over 10 gallstones in the gallbladder measuring up to about 0.8 cm in diameter. None of these appear lodged in the gallbladder neck. The common hepatic duct measures up to 0.7 cm in diameter with the common bile duct up to 0.6 cm in diameter, localize narrowing of the common bile duct for example on image 37 series 10 is thought to be due to a crossing vessel.   Although not particularly favored on other imaging planes, on MRCP images 51 through 54 of series 10, there is somewhat abrupt truncation of the common bile duct distally in a manner which raises concern for a small CBD stone. dd there is no intrahepatic biliary dilatation.   0.6 cm T2 hyperintense lesion in the dome of the right hepatic lobe on image 9 series 15 demonstrates early and delayed enhancement compatible with a small flash filling hemangioma.   Pancreas:  Unremarkable   Spleen:  Unremarkable   Adrenals/Urinary Tract: Simple right renal cysts noted. A 0.6 by 0.5 cm left kidney lower pole cyst on image 54 series 30 may have a subtle internal septation and accordingly  is all likely Bosniak category 2 (complex, but benign). Other tiny benign-appearing left renal cysts are present.   Stomach/Bowel: Unremarkable   Vascular/Lymphatic:  Unremarkable   Other:  No supplemental non-categorized findings.   Musculoskeletal: Mild levoconvex thoracic scoliosis.   IMPRESSION: 1. Cholelithiasis is present, with borderline prominence of the common bile duct, and with truncation of the distal CBD for example on image 37 series 10 in a manner which can be seen with a small distal CBD stone. 2. Mild cardiomegaly. 3. Small hemangioma in the right hepatic lobe. 4. Benign-appearing renal cysts. 5. Mild levoconvex thoracic scoliosis.     Electronically Signed   By: Van Clines M.D.   On: 05/04/2021 06:52    Assessment and Plan:   Diagnoses and all orders for this visit:  Chronic cholecystitis    Recommend laparoscopic cholecystectomy with intraoperative cholangiogram.  The cholangiogram will help [place] evaluate the common bile duct.  If the cholangiogram is positive, she will need to stay in the hospital overnight for possible ERCP by Dr. Carlean Purl.  The surgical procedure has been discussed with the patient.  Potential risks, benefits, alternative treatments, and expected outcomes have been explained.  All of the patient's questions at this time have been answered.  The likelihood of reaching the patient's treatment goal is good.  The patient understand the proposed surgical procedure and wishes to proceed.   No follow-ups on file.  Carlean Jews, MD  05/18/2021 11:49 AM

## 2021-05-18 NOTE — Progress Notes (Signed)
Rectal biopsies show proctitis (she has chronic ulcerative proctitis) Other biopsies show melanosis which is usually a sign of prior use of certain laxatives  Plan: 1) [place] place a 10 yr recqll 2) Office - call her and Rx mesalamine suppository 1000 mg take 1 rectal qhs - # 30 w/ 3 refills - she should do this for 2 months straight and then can go prn 3) office f/u me 1 year or prn sooner

## 2021-06-23 ENCOUNTER — Other Ambulatory Visit: Payer: Self-pay | Admitting: Surgery

## 2021-06-23 DIAGNOSIS — K8010 Calculus of gallbladder with chronic cholecystitis without obstruction: Secondary | ICD-10-CM | POA: Diagnosis not present

## 2021-06-23 DIAGNOSIS — K811 Chronic cholecystitis: Secondary | ICD-10-CM | POA: Diagnosis not present

## 2021-09-21 DIAGNOSIS — M9903 Segmental and somatic dysfunction of lumbar region: Secondary | ICD-10-CM | POA: Diagnosis not present

## 2021-09-21 DIAGNOSIS — S338XXA Sprain of other parts of lumbar spine and pelvis, initial encounter: Secondary | ICD-10-CM | POA: Diagnosis not present

## 2021-09-21 DIAGNOSIS — M546 Pain in thoracic spine: Secondary | ICD-10-CM | POA: Diagnosis not present

## 2021-09-21 DIAGNOSIS — M4124 Other idiopathic scoliosis, thoracic region: Secondary | ICD-10-CM | POA: Diagnosis not present

## 2021-09-21 DIAGNOSIS — M9901 Segmental and somatic dysfunction of cervical region: Secondary | ICD-10-CM | POA: Diagnosis not present

## 2021-09-21 DIAGNOSIS — M47812 Spondylosis without myelopathy or radiculopathy, cervical region: Secondary | ICD-10-CM | POA: Diagnosis not present

## 2021-09-21 DIAGNOSIS — M9902 Segmental and somatic dysfunction of thoracic region: Secondary | ICD-10-CM | POA: Diagnosis not present

## 2021-09-29 DIAGNOSIS — M4124 Other idiopathic scoliosis, thoracic region: Secondary | ICD-10-CM | POA: Diagnosis not present

## 2021-09-29 DIAGNOSIS — M9903 Segmental and somatic dysfunction of lumbar region: Secondary | ICD-10-CM | POA: Diagnosis not present

## 2021-09-29 DIAGNOSIS — M546 Pain in thoracic spine: Secondary | ICD-10-CM | POA: Diagnosis not present

## 2021-09-29 DIAGNOSIS — M9902 Segmental and somatic dysfunction of thoracic region: Secondary | ICD-10-CM | POA: Diagnosis not present

## 2021-09-29 DIAGNOSIS — M9901 Segmental and somatic dysfunction of cervical region: Secondary | ICD-10-CM | POA: Diagnosis not present

## 2021-09-29 DIAGNOSIS — M47812 Spondylosis without myelopathy or radiculopathy, cervical region: Secondary | ICD-10-CM | POA: Diagnosis not present

## 2021-09-29 DIAGNOSIS — S338XXA Sprain of other parts of lumbar spine and pelvis, initial encounter: Secondary | ICD-10-CM | POA: Diagnosis not present

## 2021-10-14 DIAGNOSIS — S338XXA Sprain of other parts of lumbar spine and pelvis, initial encounter: Secondary | ICD-10-CM | POA: Diagnosis not present

## 2021-10-14 DIAGNOSIS — M9902 Segmental and somatic dysfunction of thoracic region: Secondary | ICD-10-CM | POA: Diagnosis not present

## 2021-10-14 DIAGNOSIS — M4124 Other idiopathic scoliosis, thoracic region: Secondary | ICD-10-CM | POA: Diagnosis not present

## 2021-10-14 DIAGNOSIS — M9901 Segmental and somatic dysfunction of cervical region: Secondary | ICD-10-CM | POA: Diagnosis not present

## 2021-10-14 DIAGNOSIS — M546 Pain in thoracic spine: Secondary | ICD-10-CM | POA: Diagnosis not present

## 2021-10-14 DIAGNOSIS — M9903 Segmental and somatic dysfunction of lumbar region: Secondary | ICD-10-CM | POA: Diagnosis not present

## 2021-10-14 DIAGNOSIS — M47812 Spondylosis without myelopathy or radiculopathy, cervical region: Secondary | ICD-10-CM | POA: Diagnosis not present

## 2021-11-12 DIAGNOSIS — M47812 Spondylosis without myelopathy or radiculopathy, cervical region: Secondary | ICD-10-CM | POA: Diagnosis not present

## 2021-11-12 DIAGNOSIS — M4124 Other idiopathic scoliosis, thoracic region: Secondary | ICD-10-CM | POA: Diagnosis not present

## 2021-11-12 DIAGNOSIS — M9901 Segmental and somatic dysfunction of cervical region: Secondary | ICD-10-CM | POA: Diagnosis not present

## 2021-11-12 DIAGNOSIS — M9903 Segmental and somatic dysfunction of lumbar region: Secondary | ICD-10-CM | POA: Diagnosis not present

## 2021-11-12 DIAGNOSIS — S338XXA Sprain of other parts of lumbar spine and pelvis, initial encounter: Secondary | ICD-10-CM | POA: Diagnosis not present

## 2021-11-12 DIAGNOSIS — M546 Pain in thoracic spine: Secondary | ICD-10-CM | POA: Diagnosis not present

## 2021-11-12 DIAGNOSIS — M9902 Segmental and somatic dysfunction of thoracic region: Secondary | ICD-10-CM | POA: Diagnosis not present

## 2021-12-08 DIAGNOSIS — M4124 Other idiopathic scoliosis, thoracic region: Secondary | ICD-10-CM | POA: Diagnosis not present

## 2021-12-08 DIAGNOSIS — M9902 Segmental and somatic dysfunction of thoracic region: Secondary | ICD-10-CM | POA: Diagnosis not present

## 2021-12-08 DIAGNOSIS — M9903 Segmental and somatic dysfunction of lumbar region: Secondary | ICD-10-CM | POA: Diagnosis not present

## 2021-12-08 DIAGNOSIS — M47812 Spondylosis without myelopathy or radiculopathy, cervical region: Secondary | ICD-10-CM | POA: Diagnosis not present

## 2021-12-08 DIAGNOSIS — S338XXA Sprain of other parts of lumbar spine and pelvis, initial encounter: Secondary | ICD-10-CM | POA: Diagnosis not present

## 2021-12-08 DIAGNOSIS — M546 Pain in thoracic spine: Secondary | ICD-10-CM | POA: Diagnosis not present

## 2021-12-08 DIAGNOSIS — M9901 Segmental and somatic dysfunction of cervical region: Secondary | ICD-10-CM | POA: Diagnosis not present

## 2022-01-06 DIAGNOSIS — M47812 Spondylosis without myelopathy or radiculopathy, cervical region: Secondary | ICD-10-CM | POA: Diagnosis not present

## 2022-01-06 DIAGNOSIS — M4124 Other idiopathic scoliosis, thoracic region: Secondary | ICD-10-CM | POA: Diagnosis not present

## 2022-01-06 DIAGNOSIS — M9902 Segmental and somatic dysfunction of thoracic region: Secondary | ICD-10-CM | POA: Diagnosis not present

## 2022-01-06 DIAGNOSIS — M9901 Segmental and somatic dysfunction of cervical region: Secondary | ICD-10-CM | POA: Diagnosis not present

## 2022-01-06 DIAGNOSIS — M9903 Segmental and somatic dysfunction of lumbar region: Secondary | ICD-10-CM | POA: Diagnosis not present

## 2022-01-06 DIAGNOSIS — M546 Pain in thoracic spine: Secondary | ICD-10-CM | POA: Diagnosis not present

## 2022-01-06 DIAGNOSIS — S338XXA Sprain of other parts of lumbar spine and pelvis, initial encounter: Secondary | ICD-10-CM | POA: Diagnosis not present

## 2022-01-22 ENCOUNTER — Ambulatory Visit (HOSPITAL_COMMUNITY)
Admission: RE | Admit: 2022-01-22 | Discharge: 2022-01-22 | Disposition: A | Discharge status: Home / Self Care | Payer: Medicare HMO | Source: Ambulatory Visit | Attending: Physician Assistant | Admitting: Physician Assistant

## 2022-01-22 ENCOUNTER — Other Ambulatory Visit (HOSPITAL_COMMUNITY): Payer: Self-pay | Admitting: Physician Assistant

## 2022-01-22 ENCOUNTER — Other Ambulatory Visit: Payer: Self-pay | Admitting: Physician Assistant

## 2022-01-22 DIAGNOSIS — E782 Mixed hyperlipidemia: Secondary | ICD-10-CM | POA: Diagnosis not present

## 2022-01-22 DIAGNOSIS — R030 Elevated blood-pressure reading, without diagnosis of hypertension: Secondary | ICD-10-CM | POA: Diagnosis not present

## 2022-01-22 DIAGNOSIS — Z86718 Personal history of other venous thrombosis and embolism: Secondary | ICD-10-CM | POA: Diagnosis not present

## 2022-01-22 DIAGNOSIS — M79605 Pain in left leg: Secondary | ICD-10-CM | POA: Diagnosis not present

## 2022-01-22 DIAGNOSIS — M79606 Pain in leg, unspecified: Secondary | ICD-10-CM | POA: Diagnosis not present

## 2022-01-22 DIAGNOSIS — Z6827 Body mass index (BMI) 27.0-27.9, adult: Secondary | ICD-10-CM | POA: Diagnosis not present

## 2022-01-22 DIAGNOSIS — M79662 Pain in left lower leg: Secondary | ICD-10-CM | POA: Diagnosis not present

## 2022-02-11 DIAGNOSIS — M546 Pain in thoracic spine: Secondary | ICD-10-CM | POA: Diagnosis not present

## 2022-02-11 DIAGNOSIS — M9902 Segmental and somatic dysfunction of thoracic region: Secondary | ICD-10-CM | POA: Diagnosis not present

## 2022-02-11 DIAGNOSIS — M9903 Segmental and somatic dysfunction of lumbar region: Secondary | ICD-10-CM | POA: Diagnosis not present

## 2022-02-11 DIAGNOSIS — M47812 Spondylosis without myelopathy or radiculopathy, cervical region: Secondary | ICD-10-CM | POA: Diagnosis not present

## 2022-02-11 DIAGNOSIS — M9901 Segmental and somatic dysfunction of cervical region: Secondary | ICD-10-CM | POA: Diagnosis not present

## 2022-02-11 DIAGNOSIS — M4124 Other idiopathic scoliosis, thoracic region: Secondary | ICD-10-CM | POA: Diagnosis not present

## 2022-02-11 DIAGNOSIS — S338XXA Sprain of other parts of lumbar spine and pelvis, initial encounter: Secondary | ICD-10-CM | POA: Diagnosis not present

## 2022-04-02 DIAGNOSIS — M546 Pain in thoracic spine: Secondary | ICD-10-CM | POA: Diagnosis not present

## 2022-04-02 DIAGNOSIS — M47812 Spondylosis without myelopathy or radiculopathy, cervical region: Secondary | ICD-10-CM | POA: Diagnosis not present

## 2022-04-02 DIAGNOSIS — S338XXA Sprain of other parts of lumbar spine and pelvis, initial encounter: Secondary | ICD-10-CM | POA: Diagnosis not present

## 2022-04-02 DIAGNOSIS — M9903 Segmental and somatic dysfunction of lumbar region: Secondary | ICD-10-CM | POA: Diagnosis not present

## 2022-04-02 DIAGNOSIS — M4124 Other idiopathic scoliosis, thoracic region: Secondary | ICD-10-CM | POA: Diagnosis not present

## 2022-04-02 DIAGNOSIS — M9902 Segmental and somatic dysfunction of thoracic region: Secondary | ICD-10-CM | POA: Diagnosis not present

## 2022-04-02 DIAGNOSIS — M9901 Segmental and somatic dysfunction of cervical region: Secondary | ICD-10-CM | POA: Diagnosis not present

## 2022-06-08 DIAGNOSIS — M4124 Other idiopathic scoliosis, thoracic region: Secondary | ICD-10-CM | POA: Diagnosis not present

## 2022-06-08 DIAGNOSIS — M9903 Segmental and somatic dysfunction of lumbar region: Secondary | ICD-10-CM | POA: Diagnosis not present

## 2022-06-08 DIAGNOSIS — M47812 Spondylosis without myelopathy or radiculopathy, cervical region: Secondary | ICD-10-CM | POA: Diagnosis not present

## 2022-06-08 DIAGNOSIS — M546 Pain in thoracic spine: Secondary | ICD-10-CM | POA: Diagnosis not present

## 2022-06-08 DIAGNOSIS — M9901 Segmental and somatic dysfunction of cervical region: Secondary | ICD-10-CM | POA: Diagnosis not present

## 2022-06-08 DIAGNOSIS — M9902 Segmental and somatic dysfunction of thoracic region: Secondary | ICD-10-CM | POA: Diagnosis not present

## 2022-06-08 DIAGNOSIS — S338XXA Sprain of other parts of lumbar spine and pelvis, initial encounter: Secondary | ICD-10-CM | POA: Diagnosis not present

## 2022-07-05 ENCOUNTER — Telehealth: Payer: Self-pay | Admitting: Internal Medicine

## 2022-07-05 NOTE — Telephone Encounter (Signed)
Inbound call from patient stating that she needed to make an appointment with Dr. Carlean Purl due to feeling like she may possibly be having an ulcerative colitis  flare up but is not sure If that is what it is. Patient was scheduled for 1/30 at 9:50, and is requesting a call back to discuss symptoms and to see if she can possibly get in sooner. Please advise.

## 2022-07-06 NOTE — Telephone Encounter (Signed)
Pt stated that she could not make that appointment due to her going out of town and she is supposed to put her car in the shop that day:

## 2022-07-06 NOTE — Telephone Encounter (Signed)
That schedule is a little off for whatever reason:  There is a pt scheduled at 10:50: Please review and advise

## 2022-07-06 NOTE — Telephone Encounter (Signed)
My mistake - look at 12/14  ? there was a cancellation

## 2022-07-06 NOTE — Telephone Encounter (Signed)
Pt states that she has been having bloating along with constipation/diarrhea associated with Mucous/blood: Very uncomfortable on right side: Symptoms have been going on for three weeks: pt thinking it is an ulcerative colitis  flare: Pt previously scheduled for an office visit on 08/24/2022 at 9:50 to see Dr. Carlean Purl Please advise

## 2022-07-06 NOTE — Telephone Encounter (Signed)
12/15 1050 is open as I look now - let's get her in there

## 2022-07-06 NOTE — Telephone Encounter (Signed)
Took a another glance at the schedule for 07/09/2022: Pt was scheduled at 10:50   That slot was available: Pt was made aware and very thankful: Pt verbalized understanding with all questions answered.

## 2022-07-09 ENCOUNTER — Ambulatory Visit: Payer: Medicare HMO | Admitting: Internal Medicine

## 2022-07-09 ENCOUNTER — Encounter: Payer: Self-pay | Admitting: Internal Medicine

## 2022-07-09 VITALS — BP 124/72 | HR 82 | Ht 60.0 in | Wt 145.0 lb

## 2022-07-09 DIAGNOSIS — K51219 Ulcerative (chronic) proctitis with unspecified complications: Secondary | ICD-10-CM | POA: Diagnosis not present

## 2022-07-09 MED ORDER — DICYCLOMINE HCL 10 MG PO CAPS: 10.0000 mg | ORAL_CAPSULE | Freq: Four times a day (QID) | ORAL | 0 refills | Status: AC | PRN

## 2022-07-09 MED ORDER — MESALAMINE 1000 MG RE SUPP: 1000.0000 mg | Freq: Every day | RECTAL | 1 refills | Status: AC

## 2022-07-09 NOTE — Patient Instructions (Signed)
We have sent the following medications to your pharmacy for you to pick up at your convenience: Dicyclomine and mesalamine suppositories  Due to recent changes in healthcare laws, you may see the results of your imaging and laboratory studies on MyChart before your provider has had a chance to review them.  We understand that in some cases there may be results that are confusing or concerning to you. Not all laboratory results come back in the same time frame and the provider may be waiting for multiple results in order to interpret others.  Please give [place] 48 hours in order for your provider to thoroughly review all the results before contacting the office for clarification of your results.    I appreciate the opportunity to care for you. Silvano Rusk, MD

## 2022-07-09 NOTE — Progress Notes (Signed)
Dawn Yang 66 y.o. 17-Aug-1955 [id number]  Assessment & Plan:   Encounter Diagnosis  Name Primary?   Chronic ulcerative proctitis with complication ([place]) Yes   I think she is having a flare of her ulcerative proctitis in the right flank pain is related.  Will treat with mesalamine suppositories again return here in 6 to 8 weeks.  Dicyclomine 10 mg as needed pain.  Call back sooner MyChart message sooner if things or not improving as expected.  I reeducated her as to the nature of this disorder and the chronicity and episodic flares and its autoimmune etiology.  CC: [name], [place]   Subjective:   GI summary:  Chronic ulcerative proctitis diagnosed at screening colonoscopy September 2015 treated with intermittent mesalamine suppositories. Last colonoscopy Tober 2022, active proctitis endoscopic and biopsies, mild melanosis throughout the rest of the colon no colitis.  Lap chole 07/14/2021 for symptomatic cholelithiasis  Chief Complaint: Abdominal pain and change in bowels  HPI 66 year old white woman with chronic ulcerative proctitis, who has been having some crampy achy pain in the right flank region as well as altered bowel habits for several weeks.  She had eaten some sort of apricot seeds prior to this starting and wonders if they contributed.  They were supposed to help her vitamin levels they were very tiny seeds.  She had active ulcerative proctitis a colonoscopy in late 2022 (1013) and mesalamine suppositories were taken for a month and she noted or remembers improvement.  She is also been having some mucoid bloody stools at times slight blood but lots of mucus.  She will move from struggling to defecate and having some diarrhea type stools.  She has tried Dulcolax and something called Swiss Gerald Stabs natural laxatives.  That has not really helped but when she does defecate the right flank pain is better.  There are no urinary symptoms.  She is  preparing to travel to [facility] next week for the Christmas holidays.  Her husband told her she better come see me before they go.  She reports laparoscopic cholecystectomy in December 2022 was successful and she was very pleased with her surgeon and the outcome. Allergies  Allergen Reactions   Codeine Nausea And Vomiting   Morphine And Related Nausea And Vomiting   Alpha-Gal Rash    If she eats red meat   Latex Rash   Current Meds  Medication Sig   b complex vitamins capsule Take 1 capsule by mouth daily.   Cod Liver Oil CAPS Take 1 capsule by mouth daily.   famotidine-calcium carbonate-magnesium hydroxide (PEPCID COMPLETE) 10-800-165 MG chewable tablet Chew 1 tablet by mouth daily as needed.   mesalamine (CANASA) 1000 MG suppository Place 1 suppository (1,000 mg total) rectally at bedtime. Take  this for 2 months straight and then take as needed   Multiple Vitamins-Minerals (MULTIVITAMIN PO) Take 1 tablet by mouth daily.   Multiple Vitamins-Minerals (ZINC PO) Take 1 capsule by mouth daily.   Omega-3 Fatty Acids (OMEGA 3 PO) Take by mouth.   VITAMIN D PO Take 1 capsule by mouth daily.   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   Review of Systems As above  of  Objective:   Physical Exam BP 124/72   Pulse 82   Ht 5' (1.524 m)   Wt 145 lb (65.8 kg)   BMI 28.32 kg/m  Overweight white woman in no acute distress The abdomen is soft and transiently tender in the right lower quadrant but not so with repeated palpation, there is no organomegaly or mass and no other tenderness.  Bowel sounds are present.  Dawn Yang, [place] present. Rectal exam reveals slightly bulging external hemorrhoids.  Digital exam is nontender without mass.  Anoscopy is performed and demonstrates active proctitis in the distal rectum.  Boggy edematous erythematous and ulcerated mucosa with white mucoid exudate.  Data reviewed includes surgery notes from 2022, previous colonoscopy 2022 and pathology.  As per HPI otherwise.

## 2022-08-24 ENCOUNTER — Ambulatory Visit: Payer: Medicare HMO | Admitting: Internal Medicine

## 2022-08-27 ENCOUNTER — Ambulatory Visit: Payer: Medicare HMO | Admitting: Internal Medicine

## 2022-10-05 ENCOUNTER — Ambulatory Visit: Payer: Medicare HMO | Admitting: Internal Medicine

## 2022-11-12 DIAGNOSIS — M1712 Unilateral primary osteoarthritis, left knee: Secondary | ICD-10-CM | POA: Diagnosis not present

## 2022-11-30 ENCOUNTER — Ambulatory Visit: Payer: Medicare HMO | Admitting: Internal Medicine

## 2023-01-01 DIAGNOSIS — G4700 Insomnia, unspecified: Secondary | ICD-10-CM | POA: Diagnosis not present

## 2023-01-01 DIAGNOSIS — Z6824 Body mass index (BMI) 24.0-24.9, adult: Secondary | ICD-10-CM | POA: Diagnosis not present

## 2023-01-01 DIAGNOSIS — R030 Elevated blood-pressure reading, without diagnosis of hypertension: Secondary | ICD-10-CM | POA: Diagnosis not present

## 2023-01-01 DIAGNOSIS — L209 Atopic dermatitis, unspecified: Secondary | ICD-10-CM | POA: Diagnosis not present

## 2023-01-01 DIAGNOSIS — Z0000 Encounter for general adult medical examination without abnormal findings: Secondary | ICD-10-CM | POA: Diagnosis not present

## 2023-01-01 DIAGNOSIS — Z91018 Allergy to other foods: Secondary | ICD-10-CM | POA: Diagnosis not present

## 2023-01-05 LAB — LAB REPORT - SCANNED: EGFR: 101

## 2023-02-09 ENCOUNTER — Encounter: Payer: Self-pay | Admitting: Internal Medicine

## 2023-02-09 ENCOUNTER — Ambulatory Visit: Payer: Medicare HMO | Admitting: Internal Medicine

## 2023-02-09 VITALS — BP 128/80 | HR 83 | Ht 60.0 in | Wt 147.4 lb

## 2023-02-09 DIAGNOSIS — K5120 Ulcerative (chronic) proctitis without complications: Secondary | ICD-10-CM

## 2023-02-09 DIAGNOSIS — Z91018 Allergy to other foods: Secondary | ICD-10-CM

## 2023-02-09 DIAGNOSIS — T887XXA Unspecified adverse effect of drug or medicament, initial encounter: Secondary | ICD-10-CM

## 2023-02-09 NOTE — Patient Instructions (Signed)
_______________________________________________________  If your blood pressure at your visit was 140/90 or greater, please contact your primary care physician to follow up on this.  _______________________________________________________  If you are age 68 or older, your body mass index should be between 23-30. Your Body mass index is 28.78 kg/m. If this is out of the aforementioned range listed, please consider follow up with your Primary Care Provider.  If you are age 47 or younger, your body mass index should be between 19-25. Your Body mass index is 28.78 kg/m. If this is out of the aformentioned range listed, please consider follow up with your Primary Care Provider.   ________________________________________________________  The [facility] providers would like to encourage you to use [facility] to communicate with providers for non-urgent requests or questions.  Due to long hold times on the telephone, sending your provider a message by [facility] may be a faster and more efficient way to get a response.  Please allow 48 business hours for a response.  Please remember that this is for non-urgent requests.  _______________________________________________________  I appreciate the opportunity to care for you. Stan Head, MD, [facility]

## 2023-02-09 NOTE — Progress Notes (Signed)
   Dawn Yang 66 y.o. May 04, 1956 [id number]  Assessment & Plan:   Encounter Diagnoses  Name Primary?   Chronic ulcerative proctitis without complications (HCC) Yes   Medication side effect - doxycycline GI upset    Allergy to alpha-gal - but tolerates some beef    No changes today  F/U PRN      Subjective:   Chief Complaint: ulcerative proctitis and medication side effects  HPI 67 yo ww w/ hx ulcerative proctitis - she had taken doxycycline and itt "tore me up inside" but is now better. Had a foot wound requiring Abx.  Labs brought today indicate alpha gal allergy - though she can tolerate grass fed beef from local farms and sxs have been GI and not anaphylactic. Had labs because she has hx tick bites - also Lyme neg CBC NL  Proctitis under control - last flare Winter 2023 and has mesalamine suppository on hand     Allergies  Allergen Reactions   Codeine Nausea And Vomiting   Morphine And Codeine Nausea And Vomiting   Alpha-Gal Rash    If she eats red meat   Latex Rash   Current Meds  Medication Sig   b complex vitamins capsule Take 1 capsule by mouth daily.   Cod Liver Oil CAPS Take 1 capsule by mouth daily.   dicyclomine (BENTYL) 10 MG capsule Take 1 capsule (10 mg total) by mouth every 6 (six) hours as needed for spasms (abdominal cramps).   famotidine-calcium carbonate-magnesium hydroxide (PEPCID COMPLETE) 10-800-165 MG chewable tablet Chew 1 tablet by mouth daily as needed.   mesalamine (CANASA) 1000 MG suppository Place 1 suppository (1,000 mg total) rectally at bedtime. Take  this for 2 months straight and then take as needed   Multiple Vitamins-Minerals (MULTIVITAMIN PO) Take 1 tablet by mouth daily.   Multiple Vitamins-Minerals (ZINC PO) Take 1 capsule by mouth daily.   Omega-3 Fatty Acids (OMEGA 3 PO) Take by mouth.   VITAMIN D PO Take 1 capsule by mouth daily.   Past Medical History:  Diagnosis Date   Allergic reaction to alpha-gal     Allergy to alpha-gal abnormal ab - tolerates beef    Cholelithiasis 04/15/2021   Chronic ulcerative proctitis without complications (HCC) 04/04/2014   COSTOCHONDRITIS, LEFT 07/16/2008   Qualifier: Diagnosis of   By: Alwyn Ren MD, Chrissie Noa       DVT (deep venous thrombosis) (HCC)    Hepatitis C    eradicated w/ Harvoni   Osteoporosis    osteopenia   Ovarian cyst    Renal cyst    Past Surgical History:  Procedure Laterality Date   COLONOSCOPY     PARTIAL HYSTERECTOMY     Social History   Social History Narrative   Married   Firefighter   Social/occasional alcohol 2 to 4 glasses of wine a week   No drug use   Former smoker   family history includes Colon polyps in her sister; Diabetes in her father and paternal grandmother; Gallbladder disease in her father and sister; Gout in her father; Kidney disease in her father.   Review of Systems  As per HPI Objective:   Physical Exam BP 128/80   Pulse 83   Ht 5' (1.524 m)   Wt 147 lb 6 oz (66.8 kg)   BMI 28.78 kg/m  NAD

## 2023-05-05 DIAGNOSIS — M25511 Pain in right shoulder: Secondary | ICD-10-CM | POA: Diagnosis not present

## 2023-05-12 DIAGNOSIS — M25511 Pain in right shoulder: Secondary | ICD-10-CM | POA: Diagnosis not present

## 2023-07-29 IMAGING — MR MR ABDOMEN WO/W CM MRCP
17 of 21 series · 37 of 48 positions shown · IV contrast (6ml GADAVIST)
Comparison: 04/14/2021

CLINICAL DATA: Recent right upper quadrant pain, gallstones on
ultrasound with potential mild prominence of the common bile duct.

EXAM:
MRI ABDOMEN WITHOUT AND WITH CONTRAST (INCLUDING MRCP)
TECHNIQUE: Multiplanar multisequence MR imaging of the abdomen was performed
both before and after the administration of intravenous contrast.
Heavily T2-weighted images of the biliary and pancreatic ducts were
obtained, and three-dimensional MRCP images were rendered by post
processing.
CONTRAST:  6mL GADAVIST GADOBUTROL 1 MMOL/ML IV SOLN

[Series 4: T2 fat-sat · axial · 6.0mm · 1.14mm/px · 1 of 36 slices shown]
[im 1/36]
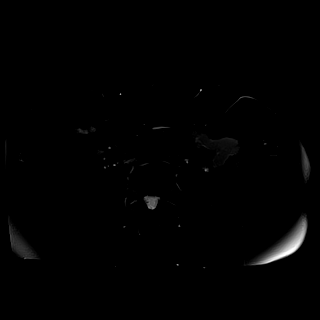

[Series 6: T2 · coronal · 6.0mm · 1.48mm/px · 1 of 30 slices shown (1 of 2)]
[im 1/30]
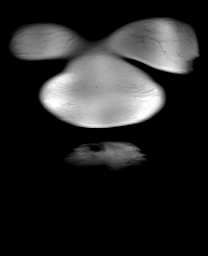

[Series 7: DWI · axial · 6.0mm · 1.38mm/px · z∈[-88,+164]mm · 2 of 72 slices shown (1 of 2)]
[im 1/72]
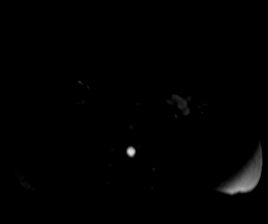
[im 72/72]
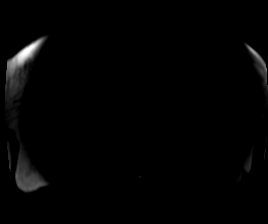

[Series 8: DWI · axial · 6.0mm · 1.38mm/px · 1 of 36 slices shown (2 of 2)]
[im 1/36]
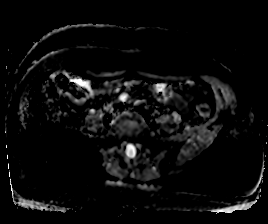

[Series 9: cor_3d_spc_trig-resp · 1 of 7 slices shown]
[im 1/7]
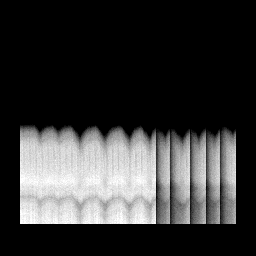

[Series 10: cor_3d_spc_trig · coronal · 1.0mm · 0.49mm/px · 3 of 88 slices shown]
[im 1/88]
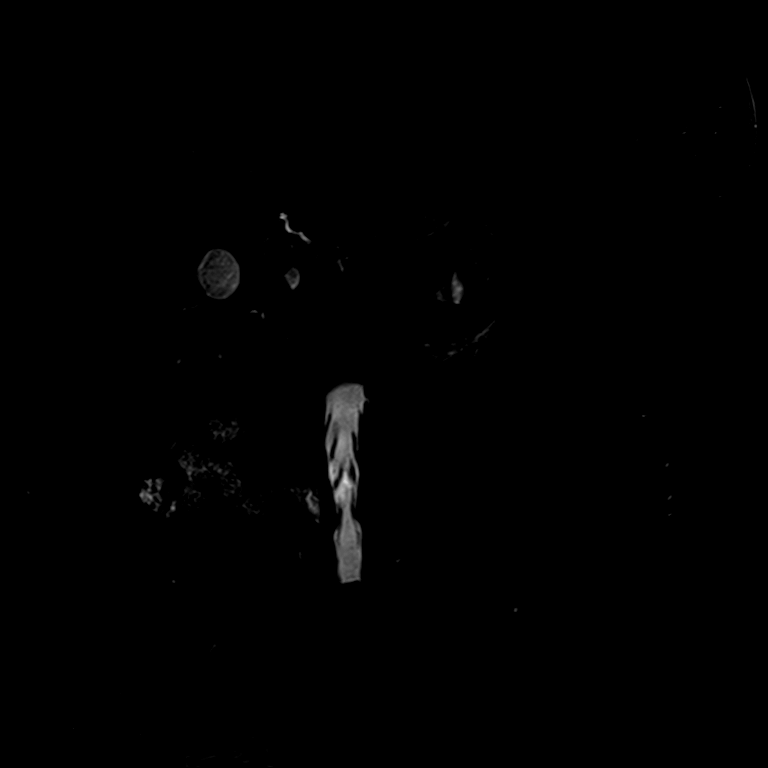
[im 44/88]
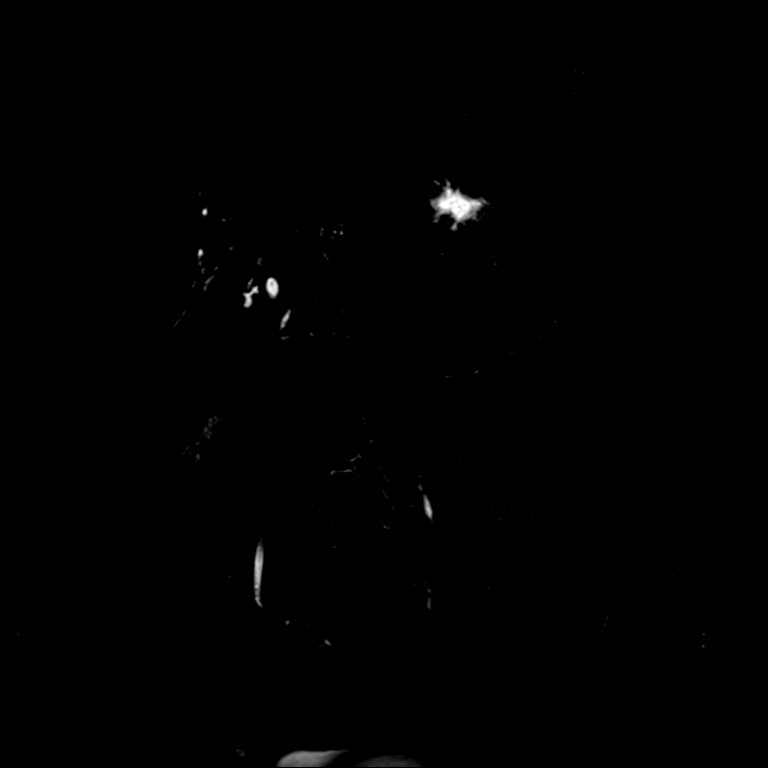
[im 88/88]
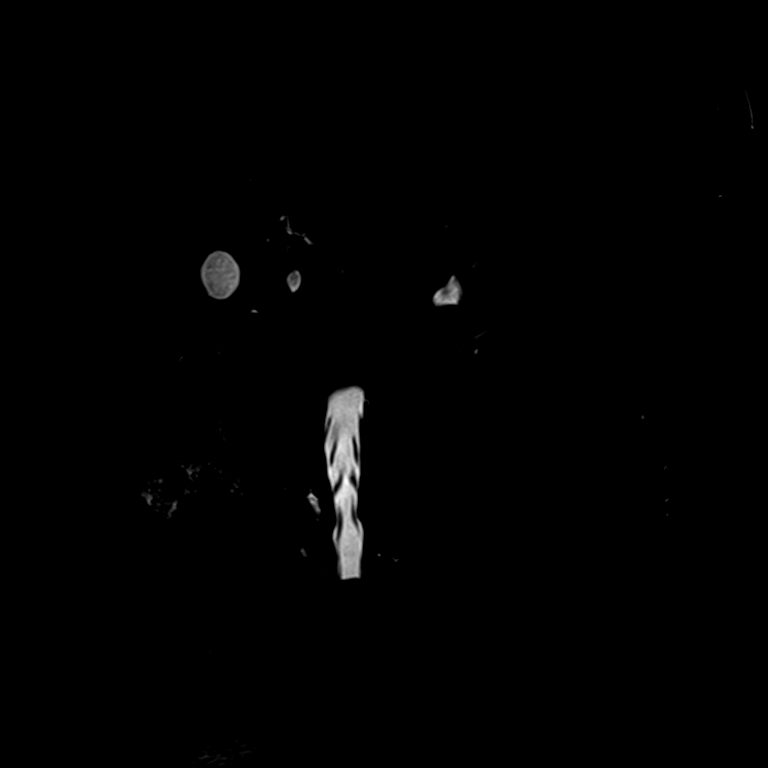

[Series 12: T1 · axial · 3.0mm · 1.12mm/px · z∈[-80,+157]mm · 3 of 80 slices shown (1 of 2)]
[im 1/80]
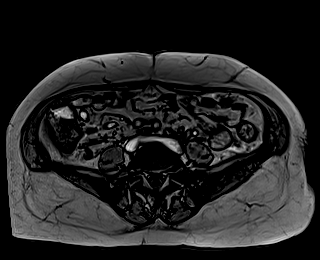
[im 40/80]
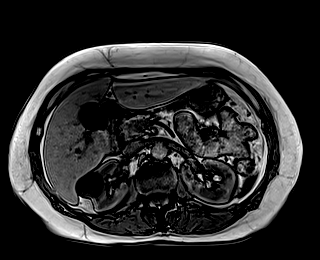
[im 80/80]
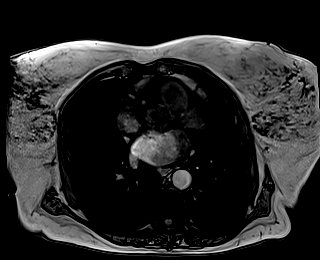

[Series 13: T1 · axial · 3.0mm · 1.12mm/px · z∈[-80,+157]mm · 3 of 80 slices shown (2 of 2)]
[im 1/80]
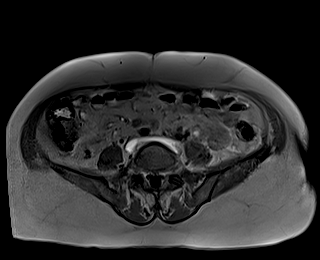
[im 40/80]
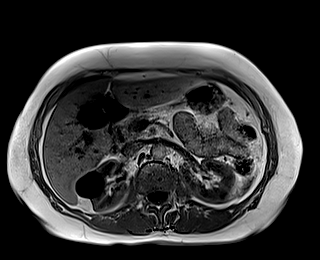
[im 80/80]
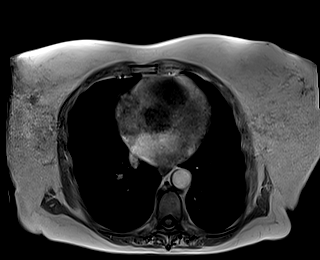

[Series 14: cor obl thk · sagittal · 50.0mm · 0.78mm/px · 1 of 9 slices shown]
[im 1/9]
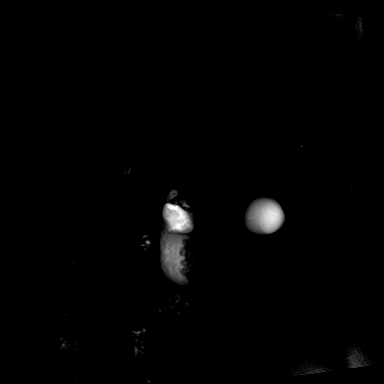

[Series 15: T2 · axial · 6.0mm · 1.45mm/px · 1 of 30 slices shown (2 of 2)]
[im 1/30]
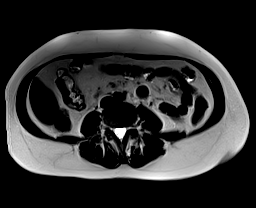

[Series 18: T1 dynamic · axial · 3.0mm · 1.16mm/px · z∈[-73,+164]mm · 3 of 80 slices shown (1 of 7)]
[im 1/80]
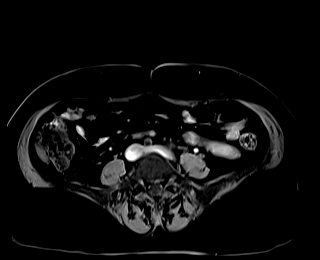
[im 40/80]
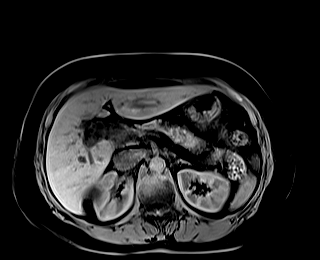
[im 80/80]
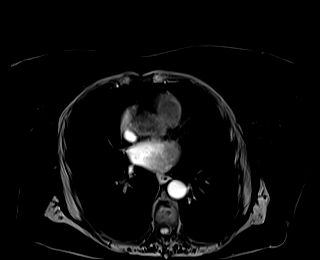

[Series 22: T1 dynamic · axial · 3.0mm · 1.16mm/px · z∈[-73,+164]mm · 3 of 80 slices shown (2 of 7)]
[im 1/80]
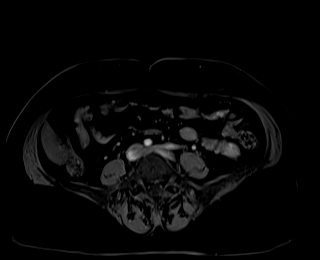
[im 40/80]
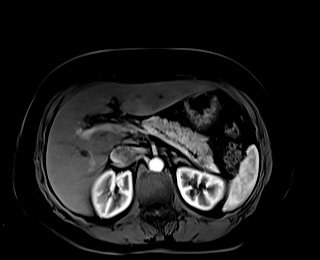
[im 80/80]
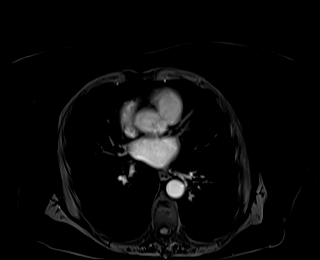

[Series 23: T1 dynamic · axial · 3.0mm · 1.16mm/px · z∈[-73,+164]mm · 3 of 80 slices shown (3 of 7)]
[im 1/80]
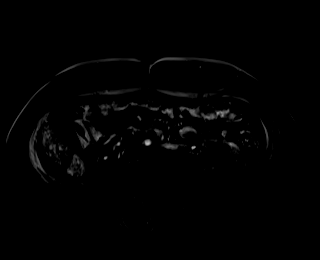
[im 40/80]
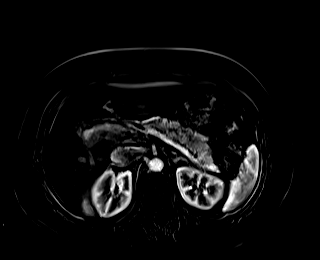
[im 80/80]
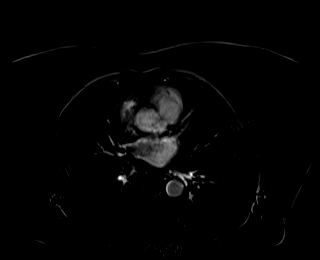

[Series 26: T1 dynamic · axial · 3.0mm · 1.16mm/px · z∈[-73,+164]mm · 3 of 80 slices shown (4 of 7)]
[im 1/80]
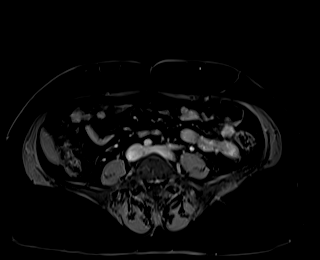
[im 40/80]
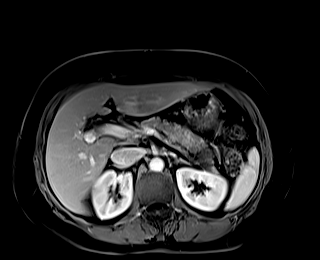
[im 80/80]
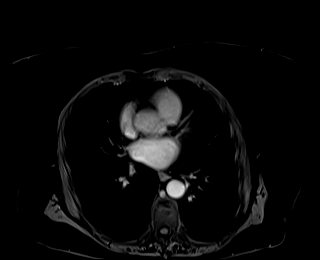

[Series 27: T1 dynamic · axial · 3.0mm · 1.16mm/px · z∈[-73,+164]mm · 3 of 80 slices shown (5 of 7)]
[im 1/80]
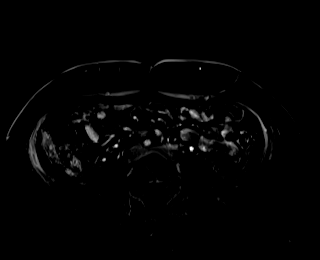
[im 40/80]
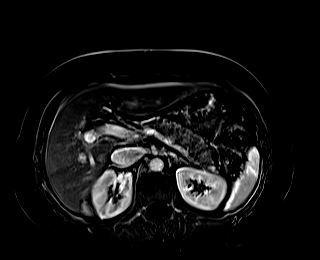
[im 80/80]
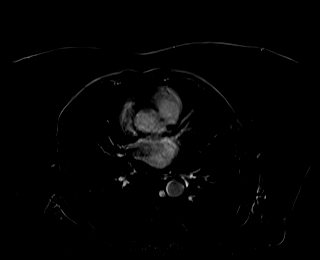

[Series 30: T1 dynamic · axial · 3.0mm · 1.16mm/px · z∈[-73,+164]mm · 3 of 80 slices shown (6 of 7)]
[im 1/80]
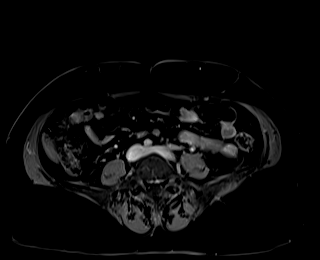
[im 40/80]
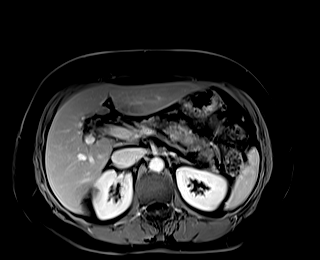
[im 80/80]
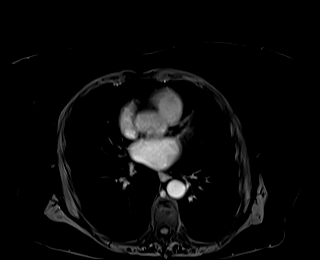

[Series 31: T1 dynamic · axial · 3.0mm · 1.16mm/px · z∈[-73,+44]mm · 2 of 80 slices shown (7 of 7)]
[im 1/80]
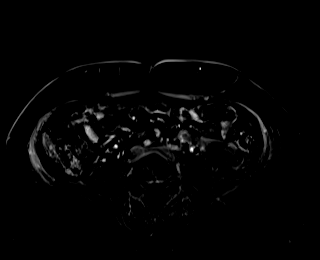
[im 40/80]
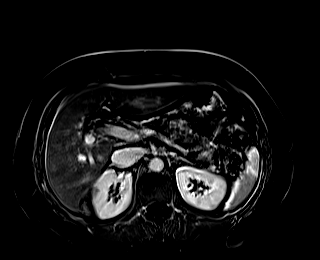

[37 of 48 positions shown; findings below may reference images not displayed]

FINDINGS: Body habitus reduces diagnostic sensitivity and specificity.

Lower chest: Mild cardiomegaly.

Hepatobiliary: There are over 10 gallstones in the gallbladder
measuring up to about 0.8 cm in diameter. None of these appear
lodged in the gallbladder neck. The common hepatic duct measures up
to 0.7 cm in diameter with the common bile duct up to 0.6 cm in
diameter, localize narrowing of the common bile duct for example on
image 37 series 10 is thought to be due to a crossing vessel.

Although not particularly favored on other imaging planes, on MRCP
images 51 through 54 of series 10, there is somewhat abrupt
truncation of the common bile duct distally in a manner which raises
concern for a small CBD stone. dd there is no intrahepatic biliary
dilatation.

0.6 cm T2 hyperintense lesion in the dome of the right hepatic lobe
on image 9 series 15 demonstrates early and delayed enhancement
compatible with a small flash filling hemangioma.

Pancreas:  Unremarkable

Spleen:  Unremarkable

Adrenals/Urinary Tract: Simple right renal cysts noted. A 0.6 by
cm left kidney lower pole cyst on image 54 series [DATE] have a
subtle internal septation and accordingly is all likely Bosniak
category 2 (complex, but benign). Other tiny benign-appearing left
renal cysts are present.

Stomach/Bowel: Unremarkable

Vascular/Lymphatic:  Unremarkable

Other:  No supplemental non-categorized findings.

Musculoskeletal: Mild levoconvex thoracic scoliosis.
IMPRESSION: 1. Cholelithiasis is present, with borderline prominence of the
common bile duct, and with truncation of the distal CBD for example
on image 37 series 10 in a manner which can be seen with a small
distal CBD stone.
2. Mild cardiomegaly.
3. Small hemangioma in the right hepatic lobe.
4. Benign-appearing renal cysts.
5. Mild levoconvex thoracic scoliosis.

## 2023-10-06 DIAGNOSIS — M1712 Unilateral primary osteoarthritis, left knee: Secondary | ICD-10-CM | POA: Diagnosis not present

## 2023-10-13 DIAGNOSIS — M1712 Unilateral primary osteoarthritis, left knee: Secondary | ICD-10-CM | POA: Diagnosis not present

## 2023-10-24 DIAGNOSIS — M1712 Unilateral primary osteoarthritis, left knee: Secondary | ICD-10-CM | POA: Diagnosis not present

## 2024-02-02 DIAGNOSIS — G4700 Insomnia, unspecified: Secondary | ICD-10-CM | POA: Diagnosis not present

## 2024-02-02 DIAGNOSIS — Z0000 Encounter for general adult medical examination without abnormal findings: Secondary | ICD-10-CM | POA: Diagnosis not present

## 2024-02-02 DIAGNOSIS — M546 Pain in thoracic spine: Secondary | ICD-10-CM | POA: Diagnosis not present

## 2024-02-02 DIAGNOSIS — Z6823 Body mass index (BMI) 23.0-23.9, adult: Secondary | ICD-10-CM | POA: Diagnosis not present

## 2024-02-02 DIAGNOSIS — M542 Cervicalgia: Secondary | ICD-10-CM | POA: Diagnosis not present

## 2024-05-10 DIAGNOSIS — M25562 Pain in left knee: Secondary | ICD-10-CM | POA: Diagnosis not present

## 2024-07-16 DIAGNOSIS — M1712 Unilateral primary osteoarthritis, left knee: Secondary | ICD-10-CM | POA: Diagnosis not present
